# Patient Record
Sex: Male | Born: 1960 | Race: White | Hispanic: No | Marital: Married | Smoking: Never smoker
Health system: Southern US, Community
[De-identification: ages and names within clinical notes are randomized; demographics above are authoritative.]

## PROBLEM LIST (undated history)

## (undated) DIAGNOSIS — G4730 Sleep apnea, unspecified: Secondary | ICD-10-CM

## (undated) DIAGNOSIS — H269 Unspecified cataract: Secondary | ICD-10-CM

## (undated) DIAGNOSIS — I10 Essential (primary) hypertension: Secondary | ICD-10-CM

## (undated) DIAGNOSIS — E785 Hyperlipidemia, unspecified: Secondary | ICD-10-CM

## (undated) DIAGNOSIS — K219 Gastro-esophageal reflux disease without esophagitis: Secondary | ICD-10-CM

## (undated) DIAGNOSIS — T7840XA Allergy, unspecified, initial encounter: Secondary | ICD-10-CM

## (undated) DIAGNOSIS — H33312 Horseshoe tear of retina without detachment, left eye: Secondary | ICD-10-CM

## (undated) HISTORY — PX: ANAL FISSURE REPAIR: SHX2312

## (undated) HISTORY — DX: Horseshoe tear of retina without detachment, left eye: H33.312

## (undated) HISTORY — PX: EYE SURGERY: SHX253

## (undated) HISTORY — DX: Hyperlipidemia, unspecified: E78.5

## (undated) HISTORY — DX: Allergy, unspecified, initial encounter: T78.40XA

## (undated) HISTORY — DX: Gastro-esophageal reflux disease without esophagitis: K21.9

## (undated) HISTORY — DX: Essential (primary) hypertension: I10

## (undated) HISTORY — DX: Sleep apnea, unspecified: G47.30

## (undated) HISTORY — DX: Unspecified cataract: H26.9

---

## 2004-01-26 ENCOUNTER — Ambulatory Visit: Payer: Self-pay | Admitting: Internal Medicine

## 2004-02-22 ENCOUNTER — Ambulatory Visit: Payer: Self-pay | Admitting: Internal Medicine

## 2004-03-18 ENCOUNTER — Ambulatory Visit: Payer: Self-pay | Admitting: Family Medicine

## 2004-09-04 ENCOUNTER — Ambulatory Visit: Payer: Self-pay | Admitting: Internal Medicine

## 2005-04-23 ENCOUNTER — Ambulatory Visit: Payer: Self-pay | Admitting: Internal Medicine

## 2005-05-07 ENCOUNTER — Ambulatory Visit: Payer: Self-pay | Admitting: Internal Medicine

## 2006-02-28 ENCOUNTER — Ambulatory Visit: Payer: Self-pay | Admitting: Internal Medicine

## 2006-02-28 LAB — CONVERTED CEMR LAB
Alkaline Phosphatase: 66 units/L (ref 39–117)
Bilirubin, Direct: 0.3 mg/dL (ref 0.0–0.3)
CO2: 27 meq/L (ref 19–32)
Cholesterol: 250 mg/dL (ref 0–200)
Creatinine, Ser: 1 mg/dL (ref 0.4–1.5)
Glucose, Bld: 88 mg/dL (ref 70–99)
HDL: 34.1 mg/dL — ABNORMAL LOW (ref >39.0)
LDL DIRECT: 175.1 mg/dL
Potassium: 4.6 meq/L (ref 3.5–5.1)
Sodium: 138 meq/L (ref 135–145)
Total Bilirubin: 1.5 mg/dL — ABNORMAL HIGH (ref 0.3–1.2)
Total Protein: 7.4 g/dL (ref 6.0–8.3)

## 2006-03-25 ENCOUNTER — Ambulatory Visit: Payer: Self-pay | Admitting: Internal Medicine

## 2006-05-06 ENCOUNTER — Ambulatory Visit: Payer: Self-pay | Admitting: Internal Medicine

## 2006-05-06 LAB — CONVERTED CEMR LAB
AST: 32 units/L (ref 0–37)
CRP, High Sensitivity: 2 (ref 0.00–5.00)
Cholesterol: 104 mg/dL (ref 0–200)
LDL Cholesterol: 41 mg/dL (ref 0–99)

## 2006-06-04 ENCOUNTER — Ambulatory Visit: Payer: Self-pay | Admitting: Internal Medicine

## 2006-08-02 ENCOUNTER — Ambulatory Visit: Payer: Self-pay | Admitting: Internal Medicine

## 2006-12-16 ENCOUNTER — Telehealth: Payer: Self-pay | Admitting: Internal Medicine

## 2007-04-11 ENCOUNTER — Ambulatory Visit: Payer: Self-pay | Admitting: Internal Medicine

## 2007-04-11 DIAGNOSIS — E782 Mixed hyperlipidemia: Secondary | ICD-10-CM

## 2007-04-11 DIAGNOSIS — R079 Chest pain, unspecified: Secondary | ICD-10-CM

## 2007-06-03 ENCOUNTER — Ambulatory Visit: Payer: Self-pay | Admitting: Internal Medicine

## 2007-06-08 LAB — CONVERTED CEMR LAB
ALT: 27 units/L (ref 0–53)
AST: 28 units/L (ref 0–37)
HDL: 28.9 mg/dL — ABNORMAL LOW (ref >39.0)
Total Bilirubin: 1.4 mg/dL — ABNORMAL HIGH (ref 0.3–1.2)

## 2007-06-09 ENCOUNTER — Telehealth: Payer: Self-pay | Admitting: *Deleted

## 2007-06-09 ENCOUNTER — Ambulatory Visit: Payer: Self-pay | Admitting: Internal Medicine

## 2007-06-27 ENCOUNTER — Telehealth (INDEPENDENT_AMBULATORY_CARE_PROVIDER_SITE_OTHER): Payer: Self-pay | Admitting: *Deleted

## 2007-07-16 ENCOUNTER — Ambulatory Visit: Payer: Self-pay | Admitting: Cardiovascular Disease

## 2007-07-25 ENCOUNTER — Ambulatory Visit: Payer: Self-pay

## 2007-07-25 ENCOUNTER — Encounter: Payer: Self-pay | Admitting: Cardiovascular Disease

## 2010-07-18 NOTE — Assessment & Plan Note (Signed)
[facility]                            CARDIOLOGY OFFICE NOTE   Jason Chavez, Jason Chavez                  MRN:          [id number]  DATE:07/16/2007                            DOB:          08-Nov-1960    Mr. Hinderliter is a 50-year patient of Dr. Fabian Sharp.  He has been having  chest pain.  He has hypercholesterolemia.   Chest pain is atypical, is clearly related to Statin drugs.  The patient  has had hypercholesterolemia for quite some time he describes a litany  of cholesterol drug trials dating back approximately 4 years.   He initially was started on Lipitor, he was then switched to Zocor and  then switch to Vytorin and now is on Crestor almost.  All of the time  that he has been on a Statin drug for any length of time, he gets  atypical chest pain.  It is nonexertional, it is sharp, it is tender to  the touch and it is not pleuritic.  It clearly is related to be on  Statins for a while.  His last LDL cholesterol done Jul 14, 2007, showed  an HDL of 28.9, LDL of 66.   His LFTs have  been normal.  I do not have CPK results.   The patient's coronary risk factors otherwise include positive family  history, father dying at age 58 of heart disease.   He is not hypertensive, he has an excellent vegan diet.  He is not  diabetic and does not smoke.   The patient actually, from what I can tell, would not really want to be  on Statin drugs.   He is otherwise active.  He exercises and walks 30 minutes a day without  chest pain.  There is no PND, orthopnea, no palpitations or syncope.   PAST MEDICAL HISTORY:  Otherwise unremarkable.  He has not had any  previous surgery.   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  He is married.  He has a 52 year old daughter.   FAMILY HISTORY:  Remarkable for mother dying of natural causes at age  15.  Father dying of heart disease at age 67.   He is currently taking Crestor or 10 a  day, although I think he stopped  it last week.   REVIEW OF SYSTEMS:  Otherwise remarkable for allergies.   PHYSICAL EXAMINATION:  GENERAL APPEARANCE:  Remarkable for a healthy-  appearing [place] male in no distress.  VITAL SIGNS:  Blood pressure is 120/70, pulse is 72 and regular,  afebrile, respiratory rate 14.  HEENT:  Unremarkable.  NECK:  Carotids are normal without bruit, no lymphadenopathy,  thyromegaly or JVP elevation.  LUNGS:  Clear with good diaphragmatic motion.  No wheezing.  There is a  systolic ejection murmur.  PMI normal.  ABDOMEN:  Benign.  Bowel sounds positive, no AAA, no tenderness, no  hepatosplenomegaly or hepatojugular reflux.  No tenderness, no bruit.  EXTREMITIES:  Distal pulses are intact, no edema.  NEURO:  Nonfocal.  SKIN:  Warm and dry.  MUSCULOSKELETAL:  No muscular weakness.  EKG is normal.   IMPRESSION:  1. Atypical chest pain in the setting of Statin drugs.  Since the      patient has a systolic murmur and he needs an echo, we will do a      stress echo on him.  2. Systolic ejection murmur, rule out bicuspid aortic valve, sounds      benign, but needs to be interrogated.  Check echo.  3. History of hypercholesterolemia.  I think that the patient should      be not on Statin drugs.  He has tried at least five different ones      and they all seem to cause chest pain and make him feel poorly.      His low HDL can be treated with a glass of wine if appropriate and      regular exercise.  I suspect that if he tried Niaspan, which would      be a better drug for racing HDL, that he may get flushing given his      side effects from Statin drugs.  However, it is pretty clear over      the last 4 years that the patient does not tolerate Statin drugs.   So long as his stress echo is normal, he will be seen on an as-needed  basis.     Noralyn Pick. Eden Emms, MD, [facility]  Electronically Signed    PCN/MedQ  DD: 07/16/2007  DT: 07/16/2007  Job #: [id number]

## 2012-06-02 ENCOUNTER — Encounter: Payer: Self-pay | Admitting: Internal Medicine

## 2012-06-02 ENCOUNTER — Ambulatory Visit (INDEPENDENT_AMBULATORY_CARE_PROVIDER_SITE_OTHER): Payer: 59 | Admitting: Internal Medicine

## 2012-06-02 VITALS — BP 114/78 | HR 64 | Wt 206.6 lb

## 2012-06-02 DIAGNOSIS — K625 Hemorrhage of anus and rectum: Secondary | ICD-10-CM

## 2012-06-02 DIAGNOSIS — Z1211 Encounter for screening for malignant neoplasm of colon: Secondary | ICD-10-CM

## 2012-06-02 DIAGNOSIS — K219 Gastro-esophageal reflux disease without esophagitis: Secondary | ICD-10-CM

## 2012-06-02 DIAGNOSIS — K5900 Constipation, unspecified: Secondary | ICD-10-CM | POA: Insufficient documentation

## 2012-06-02 MED ORDER — POLYETHYLENE GLYCOL 3350 17 G PO PACK: 17.0000 g | PACK | Freq: Every day | ORAL | Status: DC

## 2012-06-02 MED ORDER — PEG-KCL-NACL-NASULF-NA ASC-C 100 G PO SOLR: 1.0000 | Freq: Once | ORAL | Status: DC

## 2012-06-02 MED ORDER — FAMOTIDINE 20 MG PO TABS: 20.0000 mg | ORAL_TABLET | Freq: Every evening | ORAL | Status: DC | PRN

## 2012-06-02 NOTE — Patient Instructions (Addendum)
You have been scheduled for a colonoscopy with propofol. Please follow written instructions given to you at your visit today.  Please pick up your prep kit at the pharmacy within the next 1-3 days. If you use inhalers (even only as needed), please bring them with you on the day of your procedure.  We have sent the following medications to your pharmacy for you to pick up at your convenience: pepcid, miralax                                               We are excited to introduce MyChart, a new best-in-class service that provides you online access to important information in your electronic medical record. We want to make it easier for you to view your health information - all in one secure location - when and where you need it. We expect MyChart will enhance the quality of care and service we provide.  When you register for MyChart, you can:    View your test results.    Request appointments and receive appointment reminders via email.    Request medication renewals.    View your medical history, allergies, medications and immunizations.    Communicate with your physician's office through a password-protected site.    Conveniently print information such as your medication lists.  To find out if MyChart is right for you, please talk to a member of our clinical staff today. We will gladly answer your questions about this free health and wellness tool.  If you are age 54 or older and want a member of your family to have access to your record, you must provide written consent by completing a proxy form available at our office. Please speak to our clinical staff about guidelines regarding accounts for patients younger than age 43.  As you activate your MyChart account and need any technical assistance, please call the MyChart technical support line at [phone]) or email your question to [email or web address]. If you email your question(s), please include your name, a  return phone number and the best time to reach you.  If you have non-urgent health-related questions, you can send a message to our office through MyChart at [email or web address]. If you have a medical emergency, call 911.  Thank you for using MyChart as your new health and wellness resource!   MyChart licensed from [employer],  4540-9811. Patents Pending.

## 2012-06-02 NOTE — Progress Notes (Signed)
Patient ID: Jason Chavez, male   DOB: [birth year], 52 y.o.   MRN: [id number] HPI: Mr. Quast is a 52 yo male with a past medical history of hyperlipidemia who is seen in consultation at the request of Dr. Felipa Eth for evaluation of constipation and lower abdominal pain. The patient reports last week he developed what he felt was a GI "bug" with mid and lower abdominal pain making it difficult to eat. Initially this was associated with nausea. He reported some lower abdominal cramping type pain. He did have one episode of emesis, nonbloody. Last Wednesday he reportedly had a normal bowel movement but then afterwards had no bowel movements which is unusual for him. He develop constipation over the weekend went to urgent care. CT scan of the abdomen and pelvis was done and he was told that he had a fecal impaction. He was given enemas with eventual good response. He was also having difficult time with urination and they propose that this was possibly secondary to constipation. The patient reports he has had intermittent bouts of constipation over the last 6-8 months. He has occasionally seen red blood in his stool which been attributed to hemorrhoids. Otherwise he reports good appetite, no weight loss. Intermittent heartburn occurring usually at night less than weekly. It tends to be worse when he eats spicy foods and particularly large meals late at night. He has tried Pepto-Bismol for this with good relief.  Denies family history of CRC and no previous colonoscopy  Past Medical History  Diagnosis Date  . Hyperlipemia     Past Surgical History  Procedure Laterality Date  . Anal fissure repair      Current Outpatient Prescriptions  Medication Sig Dispense Refill  . polyethylene glycol (MIRALAX / GLYCOLAX) packet Take 17 g by mouth daily.      . psyllium (METAMUCIL) 58.6 % packet Take 1 packet by mouth daily.      . simvastatin (ZOCOR) 10 MG tablet Take 10 mg by mouth at bedtime.       No  current facility-administered medications for this visit.    No Known Allergies  Family History  Problem Relation Age of Onset  . Heart disease Father     History   Social History  . Marital Status: Married    Spouse Name: N/A    Number of Children: 1  . Years of Education: N/A   Occupational History  . System analyst    Social History Main Topics  . Smoking status: Never Smoker   . Smokeless tobacco: Never Used  . Alcohol Use: Yes     Comment: Wine and beer  . Drug Use: No  . Sexually Active: None   Other Topics Concern  . None   Social History Narrative  . None    ROS: As per history of present illness, otherwise negative  BP 114/78  Pulse 64  Wt 206 lb 9.6 oz (93.713 kg) Constitutional: Well-developed and well-nourished. No distress. HEENT: Normocephalic and atraumatic. Oropharynx is clear and moist. No oropharyngeal exudate. Conjunctivae are normal.  No scleral icterus. Neck: Neck supple. Trachea midline. Cardiovascular: Normal rate, regular rhythm and intact distal pulses. No M/R/G Pulmonary/chest: Effort normal and breath sounds normal. No wheezing, rales or rhonchi. Abdominal: Soft, nontender, nondistended. Bowel sounds active throughout. There are no masses palpable. No hepatosplenomegaly. Extremities: no clubbing, cyanosis, or edema Lymphadenopathy: No cervical adenopathy noted. Neurological: Alert and oriented to person place and time. Skin: Skin is warm and dry. No rashes noted. Psychiatric:  Normal mood and affect. Behavior is normal.  RELEVANT LABS AND IMAGING: Labs dated 05/31/2012 Sodium 137 potassium 4.2 chloride 100 CO2 27 BUN 13 glucose 107 craning 0.76 calcium 10.0 alkaline phosphatase 60 9T bili 0.8 total protein 8.5 albumin 4.4 AST 30 ALT 38 WBC 10.8, hemoglobin 13.8, hematocrit 40.5, platelet count 341  CT abdomen pelvis with IV contrast and depression 1. Rectum distended with stool. 2. Mild fullness of the right renal collecting system.  This may be caused by distended bladder. ("Moderate stool in a distended rectum. No diverticulosis or diverticulitis. Normal appendix. Spleen, bilateral adrenal glands, and pancreas are unremarkable. Gallbladder present. No biliary ductal palpation. No focal liver lesions. Small hiatal hernia. No adenopathy".)  ASSESSMENT/PLAN: 52 yo male with a past medical history of hyperlipidemia who is seen in consultation at the request of Dr. Felipa Eth for evaluation of constipation and lower abdominal pain.  1.  Constipation/rectal bleeding/CRC screening -- it sounds like the patient may have had a GI illness made week with resultant slower bowel transit/constipation. This all became acutely worse over the weekend necessitating ER visit where CT scan and labs were reassuring. He responded to enemas. The pain resolved with resolution of constipation. I recommended he continue with MiraLAX 17 g daily. See #2, but he will avoid Pepto-Bismol for future GERD symptoms as this can be more constipating. I have recommended colonoscopy for colorectal cancer screening and given his history of rectal bleeding. We discussed the test today including the risks and benefits and he is agreeable to proceed. If his pain returns he is asked to notify us.  2.  intermittent GERD -- I recommended Pepcid 20 mg every 12 hours when necessary for breakthrough heartburn. He is without other alarm symptom and this is rare so I feel like as needed H2 blocker is reasonable for now. This becomes more frequent, he may need PPI .

## 2012-06-03 ENCOUNTER — Encounter: Payer: Self-pay | Admitting: Internal Medicine

## 2012-06-17 ENCOUNTER — Encounter: Payer: Self-pay | Admitting: Internal Medicine

## 2012-06-24 ENCOUNTER — Telehealth: Payer: Self-pay | Admitting: Internal Medicine

## 2012-06-24 NOTE — Telephone Encounter (Signed)
Dr. Vicente Males office requested Consult records from 06/02/2012 on 06/16/2012  Faxed 5 pages of Office Note from 06/02/2012   asw

## 2012-07-14 ENCOUNTER — Encounter: Payer: Self-pay | Admitting: Internal Medicine

## 2012-07-14 ENCOUNTER — Ambulatory Visit (AMBULATORY_SURGERY_CENTER): Payer: 59 | Admitting: Internal Medicine

## 2012-07-14 VITALS — BP 115/77 | HR 57 | Temp 98.1°F | Resp 26 | Ht 73.0 in | Wt 206.0 lb

## 2012-07-14 DIAGNOSIS — D126 Benign neoplasm of colon, unspecified: Secondary | ICD-10-CM

## 2012-07-14 DIAGNOSIS — Z1211 Encounter for screening for malignant neoplasm of colon: Secondary | ICD-10-CM

## 2012-07-14 DIAGNOSIS — K625 Hemorrhage of anus and rectum: Secondary | ICD-10-CM

## 2012-07-14 MED ORDER — SODIUM CHLORIDE 0.9 % IV SOLN: 500.0000 mL | INTRAVENOUS | Status: DC

## 2012-07-14 NOTE — Progress Notes (Signed)
Patient did not experience any of the following events: a burn prior to discharge; a fall within the facility; wrong site/side/patient/procedure/implant event; or a hospital transfer or hospital admission upon discharge from the facility. (G8907) Patient did not have preoperative order for IV antibiotic SSI prophylaxis. (G8918)  

## 2012-07-14 NOTE — Patient Instructions (Addendum)
Handouts were given to your care partner on polyps, diverticulosis, high fiber diet and hemorrhoids.  You may resume your current medications today.  Please call if any questions or concerns.     YOU HAD AN ENDOSCOPIC PROCEDURE TODAY AT THE [facility]: Refer to the procedure report that was given to you for any specific questions about what was found during the examination.  If the procedure report does not answer your questions, please call your gastroenterologist to clarify.  If you requested that your care partner not be given the details of your procedure findings, then the procedure report has been included in a sealed envelope for you to review at your convenience later.  YOU SHOULD EXPECT: Some feelings of bloating in the abdomen. Passage of more gas than usual.  Walking can help get rid of the air that was put into your GI tract during the procedure and reduce the bloating. If you had a lower endoscopy (such as a colonoscopy or flexible sigmoidoscopy) you may notice spotting of blood in your stool or on the toilet paper. If you underwent a bowel prep for your procedure, then you may not have a normal bowel movement for a few days.  DIET: Your first meal following the procedure should be a light meal and then it is ok to progress to your normal diet.  A half-sandwich or bowl of soup is an example of a good first meal.  Heavy or fried foods are harder to digest and may make you feel nauseous or bloated.  Likewise meals heavy in dairy and vegetables can cause extra gas to form and this can also increase the bloating.  Drink plenty of fluids but you should avoid alcoholic beverages for 24 hours.  ACTIVITY: Your care partner should take you home directly after the procedure.  You should plan to take it easy, moving slowly for the rest of the day.  You can resume normal activity the day after the procedure however you should NOT DRIVE or use heavy machinery for 24 hours (because of the  sedation medicines used during the test).    SYMPTOMS TO REPORT IMMEDIATELY: A gastroenterologist can be reached at any hour.  During normal business hours, 8:30 AM to 5:00 PM Monday through Friday, call [phone].  After hours and on weekends, please call the GI answering service at [phone] who will take a message and have the physician on call contact you.   Following lower endoscopy (colonoscopy or flexible sigmoidoscopy):  Excessive amounts of blood in the stool  Significant tenderness or worsening of abdominal pains  Swelling of the abdomen that is new, acute  Fever of 100F or higher    FOLLOW UP: If any biopsies were taken you will be contacted by phone or by letter within the next 1-3 weeks.  Call your gastroenterologist if you have not heard about the biopsies in 3 weeks.  Our staff will call the home number listed on your records the next business day following your procedure to check on you and address any questions or concerns that you may have at that time regarding the information given to you following your procedure. This is a courtesy call and so if there is no answer at the home number and we have not heard from you through the emergency physician on call, we will assume that you have returned to your regular daily activities without incident.  SIGNATURES/CONFIDENTIALITY: You and/or your care partner have signed paperwork which will be   entered into your electronic medical record.  These signatures attest to the fact that that the information above on your After Visit Summary has been reviewed and is understood.  Full responsibility of the confidentiality of this discharge information lies with you and/or your care-partner.  

## 2012-07-14 NOTE — Progress Notes (Signed)
No complaints noted in the recovery room. Maw   

## 2012-07-14 NOTE — Progress Notes (Signed)
Called to room to assist during endoscopic procedure.  Patient ID and intended procedure confirmed with present staff. Received instructions for my participation in the procedure from the performing physician.  

## 2012-07-14 NOTE — Op Note (Signed)
[facility] [address] [place], [address]   COLONOSCOPY PROCEDURE REPORT  PATIENT: Jason Chavez, Jason Chavez  MR#: [id number] BIRTHDATE: 1960/07/07 , 51  yrs. old GENDER: Male ENDOSCOPIST: Beverley Fiedler, MD REFERRED XB:JYNW, Ravisankar PROCEDURE DATE:  07/14/2012 PROCEDURE:   Colonoscopy with cold biopsy polypectomy ASA CLASS:   Class II INDICATIONS:average risk screening and Rectal Bleeding. MEDICATIONS: MAC sedation, administered by CRNA and propofol (Diprivan) 300mg  IV  DESCRIPTION OF PROCEDURE:   After the risks benefits and alternatives of the procedure were thoroughly explained, informed consent was obtained.  A digital rectal exam revealed no rectal mass.   The LB CF-[id number]  endoscope was introduced through the anus and advanced to the cecum, which was identified by both the appendix and ileocecal valve. No adverse events experienced. The quality of the prep was good, using MoviPrep  The instrument was then slowly withdrawn as the colon was fully examined.   COLON FINDINGS: Two sessile polyps ranging between 3-69mm in size were found in the transverse colon and sigmoid colon.  Polypectomy was performed with cold forceps.  All resections were complete and all polyp tissue was completely retrieved.   Moderate diverticulosis was noted in the ascending colon and at the hepatic flexure.  Retroflexed views revealed small external hemorrhoids and was otherwise normal. The time to cecum=4 minutes 37 seconds. Withdrawal time=13 minutes 20 seconds.  The scope was withdrawn and the procedure completed. COMPLICATIONS: There were no complications.  ENDOSCOPIC IMPRESSION: 1.   Two sessile polyps ranging between 3-24mm in size were found in the transverse colon and sigmoid colon; Polypectomy was performed with cold forceps 2.   Moderate diverticulosis was noted in the ascending colon and at the hepatic flexure 3.   Small external  hemorrhoids  RECOMMENDATIONS: 1.  Await pathology results 2.  High fiber diet 3.  If the polyps removed today are proven to be adenomatous (pre-cancerous) polyps, you will need a repeat colonoscopy in 5 years.  Otherwise you should continue to follow colorectal cancer screening guidelines for "routine risk" patients with colonoscopy in 10 years.  You will receive a letter within 1-2 weeks with the results of your biopsy as well as final recommendations.  Please call my office if you have not received a letter after 3 weeks.   eSigned:  Beverley Fiedler, MD 07/14/2012 11:26 AMn       cc:The Patient

## 2012-07-15 ENCOUNTER — Telehealth: Payer: Self-pay

## 2012-07-15 NOTE — Telephone Encounter (Signed)
  Follow up Call-  Call back number 07/14/2012  Post procedure Call Back phone  # [phone]  Permission to leave phone message Yes     Patient questions:  Do you have a fever, pain , or abdominal swelling? no Pain Score  0 *  Have you tolerated food without any problems? yes  Have you been able to return to your normal activities? yes  Do you have any questions about your discharge instructions: Diet   no Medications  no Follow up visit  no  Do you have questions or concerns about your Care? no  Actions: * If pain score is 4 or above: No action needed, pain <4.

## 2012-07-17 ENCOUNTER — Encounter: Payer: Self-pay | Admitting: Internal Medicine

## 2012-07-29 ENCOUNTER — Telehealth: Payer: Self-pay | Admitting: Internal Medicine

## 2012-07-29 MED ORDER — HYDROCORTISONE ACETATE 25 MG RE SUPP: RECTAL | Status: DC

## 2012-07-29 NOTE — Telephone Encounter (Signed)
Informed pt of Dr Lauro Franklin remarks and he stated understanding; ordered suppositories.

## 2012-07-29 NOTE — Telephone Encounter (Signed)
His 2 polyps were adenomatous and small. If he would like to discuss this in person, I'm happy to see him in the office. For family members I recommend colonoscopy for colorectal cancer screening beginning at age 54 (in the absence of other symptoms or other family history which may indicate earlier exam) He did have hemorrhoids and I feel that MiraLax 17 g daily will help avoid constipation and further exacerbation of hemorrhoids We can try hydrocortisone 25 mg suppositories twice daily for 5-7 days If symptoms fail to improve he should notify us

## 2012-07-29 NOTE — Telephone Encounter (Signed)
Pt seen 06/02/12 for rectal bleeding and constipation and had COLON on 07/14/12 showing external hemorrhoids and 2 adenomatous polyps. He was to take Miralax daily for his constipation and pepcid daily if GERD did not worsen. Today, we discussed for a long time adenomatous polyps, recalls and whether his family in [place] should be concerned with CRC screening. Pt then reports he did not take Miralax Saturday and Sunday and developed pain and bleeding with his BM's. He started the Miralax again and the blood in his stools and on toilet tissue have lessened, still having a little rectal discomfort.  Can we order something for Hemorrhoids? Thanks,

## 2012-08-04 ENCOUNTER — Telehealth: Payer: Self-pay | Admitting: Internal Medicine

## 2012-08-04 NOTE — Telephone Encounter (Signed)
Given previous symptoms, trial of pantoprazole 40 mg daily He should take this 30 minutes to one hour before his first meal of the day Call back if no improvement

## 2012-08-04 NOTE — Telephone Encounter (Signed)
Pt reports he vomited Saturday and Sunday. He also c/o upper abdominal on both sides Saturday and Sunday, but today states the pain is in the middle. He denies eating spicy foods and takes Pepcid QHS PRN. Please advise. Thanks.

## 2012-08-05 NOTE — Telephone Encounter (Signed)
Spoke with pt to inform him we will order pantoprazole to see if that helps. Pt stated he figured out his problem; he took Metamucil last week and that's when his problem started. He has stopped it and now he feels better. Pt states the rectal bleeding has greatly cleared. He will call for problems and thanked [place].

## 2012-08-08 ENCOUNTER — Other Ambulatory Visit: Payer: Self-pay | Admitting: *Deleted

## 2012-08-08 MED ORDER — POLYETHYLENE GLYCOL 3350 17 GM/SCOOP PO POWD: 17.0000 g | Freq: Every day | ORAL | Status: DC

## 2012-12-10 ENCOUNTER — Other Ambulatory Visit: Payer: Self-pay | Admitting: Internal Medicine

## 2012-12-25 ENCOUNTER — Telehealth: Payer: Self-pay | Admitting: Internal Medicine

## 2012-12-25 MED ORDER — HYDROCORTISONE ACETATE 25 MG RE SUPP: 25.0000 mg | Freq: Two times a day (BID) | RECTAL | Status: DC | PRN

## 2012-12-25 NOTE — Telephone Encounter (Signed)
Pt reports his hemorrhoids are still painful and bleed at times . He has only 1 suppository left. Hemorrhoids, external listed on COLON done 07/2012. He reports he is using stool softeners and has a BM, it's just very painful and the pain lasts all day after the BM; the bleeding disturbs him. Instructed pt to take sitz baths or soaks with warn water at least BID; use baby wipes or Tucks to wipe with while irritated instead of toilet paper. Left message on pt's VM that we ordered more suppositories and to call back if this is not helping.

## 2013-01-06 ENCOUNTER — Telehealth: Payer: Self-pay | Admitting: *Deleted

## 2013-01-06 NOTE — Telephone Encounter (Signed)
Message copied by Florene Glen on Tue Jan 06, 2013  8:01 AM ------      Message from: Roda Shutters      Created: Mon Dec 29, 2012 12:21 PM       Per pt he is returning your call about last weeks conversation.  ------

## 2013-01-07 NOTE — Telephone Encounter (Signed)
lmom for pt to call back if still having problems.

## 2013-01-19 ENCOUNTER — Encounter (INDEPENDENT_AMBULATORY_CARE_PROVIDER_SITE_OTHER): Payer: Self-pay | Admitting: Surgery

## 2013-01-19 ENCOUNTER — Ambulatory Visit (INDEPENDENT_AMBULATORY_CARE_PROVIDER_SITE_OTHER): Payer: 59 | Admitting: Surgery

## 2013-01-19 ENCOUNTER — Encounter (INDEPENDENT_AMBULATORY_CARE_PROVIDER_SITE_OTHER): Payer: Self-pay

## 2013-01-19 VITALS — BP 152/90 | HR 68 | Resp 20 | Ht 72.0 in | Wt 208.8 lb

## 2013-01-19 DIAGNOSIS — K649 Unspecified hemorrhoids: Secondary | ICD-10-CM | POA: Insufficient documentation

## 2013-01-19 MED ORDER — PRAMOXINE HCL 1 % RE FOAM: 1.0000 "application " | Freq: Three times a day (TID) | RECTAL | Status: DC | PRN

## 2013-01-19 NOTE — Patient Instructions (Signed)
High-Fiber Diet Fiber is found in fruits, vegetables, and grains. A high-fiber diet encourages the addition of more whole grains, legumes, fruits, and vegetables in your diet. The recommended amount of fiber for adult males is 38 g per day. For adult females, it is 25 g per day. Pregnant and lactating women should get 28 g of fiber per day. If you have a digestive or bowel problem, ask your caregiver for advice before adding high-fiber foods to your diet. Eat a variety of high-fiber foods instead of only a select few type of foods.  PURPOSE  To increase stool bulk.  To make bowel movements more regular to prevent constipation.  To lower cholesterol.  To prevent overeating. WHEN IS THIS DIET USED?  It may be used if you have constipation and hemorrhoids.  It may be used if you have uncomplicated diverticulosis (intestine condition) and irritable bowel syndrome.  It may be used if you need help with weight management.  It may be used if you want to add it to your diet as a protective measure against atherosclerosis, diabetes, and cancer. SOURCES OF FIBER  Whole-grain breads and cereals.  Fruits, such as apples, oranges, bananas, berries, prunes, and pears.  Vegetables, such as green peas, carrots, sweet potatoes, beets, broccoli, cabbage, spinach, and artichokes.  Legumes, such split peas, soy, lentils.  Almonds. FIBER CONTENT IN FOODS Starches and Grains / Dietary Fiber (g)  Cheerios, 1 cup / 3 g  Corn Flakes cereal, 1 cup / 0.7 g  Rice crispy treat cereal, 1 cup / 0.3 g  Instant oatmeal (cooked),  cup / 2 g  Frosted wheat cereal, 1 cup / 5.1 g  Brown, long-grain rice (cooked), 1 cup / 3.5 g  White, long-grain rice (cooked), 1 cup / 0.6 g  Enriched macaroni (cooked), 1 cup / 2.5 g Legumes / Dietary Fiber (g)  Baked beans (canned, plain, or vegetarian),  cup / 5.2 g  Kidney beans (canned),  cup / 6.8 g  Pinto beans (cooked),  cup / 5.5 g Breads and Crackers  / Dietary Fiber (g)  Plain or honey graham crackers, 2 squares / 0.7 g  Saltine crackers, 3 squares / 0.3 g  Plain, salted pretzels, 10 pieces / 1.8 g  Whole-wheat bread, 1 slice / 1.9 g  White bread, 1 slice / 0.7 g  Raisin bread, 1 slice / 1.2 g  Plain bagel, 3 oz / 2 g  Flour tortilla, 1 oz / 0.9 g  Corn tortilla, 1 small / 1.5 g  Hamburger or hotdog bun, 1 small / 0.9 g Fruits / Dietary Fiber (g)  Apple with skin, 1 medium / 4.4 g  Sweetened applesauce,  cup / 1.5 g  Banana,  medium / 1.5 g  Grapes, 10 grapes / 0.4 g  Orange, 1 small / 2.3 g  Raisin, 1.5 oz / 1.6 g  Melon, 1 cup / 1.4 g Vegetables / Dietary Fiber (g)  Green beans (canned),  cup / 1.3 g  Carrots (cooked),  cup / 2.3 g  Broccoli (cooked),  cup / 2.8 g  Peas (cooked),  cup / 4.4 g  Mashed potatoes,  cup / 1.6 g  Lettuce, 1 cup / 0.5 g  Corn (canned),  cup / 1.6 g  Tomato,  cup / 1.1 g Document Released: 02/19/2005 Document Revised: 08/21/2011 Document Reviewed: 05/24/2011 [employer] Patient Information 2014 [employer]. Hemorrhoids Hemorrhoids are swollen veins around the rectum or anus. There are two types of hemorrhoids:     Internal hemorrhoids. These occur in the veins just inside the rectum. They may poke through to the outside and become irritated and painful.  External hemorrhoids. These occur in the veins outside the anus and can be felt as a painful swelling or hard lump near the anus. CAUSES  Pregnancy.   Obesity.   Constipation or diarrhea.   Straining to have a bowel movement.   Sitting for long periods on the toilet.  Heavy lifting or other activity that caused you to strain.  Anal intercourse. SYMPTOMS   Pain.   Anal itching or irritation.   Rectal bleeding.   Fecal leakage.   Anal swelling.   One or more lumps around the anus.  DIAGNOSIS  Your caregiver may be able to diagnose hemorrhoids by visual examination. Other  examinations or tests that may be performed include:   Examination of the rectal area with a gloved hand (digital rectal exam).   Examination of anal canal using a small tube (scope).   A blood test if you have lost a significant amount of blood.  A test to look inside the colon (sigmoidoscopy or colonoscopy). TREATMENT Most hemorrhoids can be treated at home. However, if symptoms do not seem to be getting better or if you have a lot of rectal bleeding, your caregiver may perform a procedure to help make the hemorrhoids get smaller or remove them completely. Possible treatments include:   Placing a rubber band at the base of the hemorrhoid to cut off the circulation (rubber band ligation).   Injecting a chemical to shrink the hemorrhoid (sclerotherapy).   Using a tool to burn the hemorrhoid (infrared light therapy).   Surgically removing the hemorrhoid (hemorrhoidectomy).   Stapling the hemorrhoid to block blood flow to the tissue (hemorrhoid stapling).  HOME CARE INSTRUCTIONS   Eat foods with fiber, such as whole grains, beans, nuts, fruits, and vegetables. Ask your doctor about taking products with added fiber in them (fibersupplements).  Increase fluid intake. Drink enough water and fluids to keep your urine clear or pale yellow.   Exercise regularly.   Go to the bathroom when you have the urge to have a bowel movement. Do not wait.   Avoid straining to have bowel movements.   Keep the anal area dry and clean. Use wet toilet paper or moist towelettes after a bowel movement.   Medicated creams and suppositories may be used or applied as directed.   Only take over-the-counter or prescription medicines as directed by your caregiver.   Take warm sitz baths for 15 20 minutes, 3 4 times a day to ease pain and discomfort.   Place ice packs on the hemorrhoids if they are tender and swollen. Using ice packs between sitz baths may be helpful.   Put ice in a  plastic bag.   Place a towel between your skin and the bag.   Leave the ice on for 15 20 minutes, 3 4 times a day.   Do not use a donut-shaped pillow or sit on the toilet for long periods. This increases blood pooling and pain.  SEEK MEDICAL CARE IF:  You have increasing pain and swelling that is not controlled by treatment or medicine.  You have uncontrolled bleeding.  You have difficulty or you are unable to have a bowel movement.  You have pain or inflammation outside the area of the hemorrhoids. MAKE SURE YOU:  Understand these instructions.  Will watch your condition.  Will get help right away if you   are not doing well or get worse. Document Released: 02/17/2000 Document Revised: 02/06/2012 Document Reviewed: 12/25/2011 [facility] Patient Information 2014 [place], [place].          Miralax  OTC 1 cap per day Metamucil or citrucel as directed on label 64 oz water daily Limit caffeine  Limit alcohol

## 2013-01-19 NOTE — Progress Notes (Signed)
Subjective:     Patient ID: Jason Chavez, male   DOB: [birth year], 52 y.o.   MRN: [id number]  HPI  Patient sent at request of Dr.Avva for hemorrhoid disease. Patient has history of procedure for hemorrhoids and indeed in the 1990s. He cannot recall exactly what was done but denies any significant pain afterwards. He is having burning, bleeding and itching. The symptoms started within the last one to 2 months. History of fecal impaction constipation in March of this year. This required trip to emergency room. Denies any recent episodes of constipation, excessive straining or difficulty having BM Review of Systems  Constitutional: Negative.   HENT: Negative.   Gastrointestinal: Positive for constipation and anal bleeding.  Genitourinary: Negative.   Skin: Negative.   Hematological: Negative.   Psychiatric/Behavioral: Negative.        Objective:   Physical Exam  Constitutional: He appears well-developed and well-nourished.  HENT:  Head: Normocephalic and atraumatic.  Eyes: Pupils are equal, round, and reactive to light. No scleral icterus.  Genitourinary:     Neurological: He is alert.  Skin: Skin is warm and dry.  Psychiatric: He has a normal mood and affect. His behavior is normal. Judgment normal.       Assessment:     Grade 3 hemorrhoid disease involving 3 columns with external component    Plan:     Discussed surgical and medical options. These are too large for banding or sclerotherapy. Discussed surgical stapling versus formal hemorrhoidectomy. Discussed medical management as well. He wishes to proceed with medical management. Return to clinic if the symptoms do not improve on medical management to discuss surgery.

## 2013-07-15 ENCOUNTER — Other Ambulatory Visit: Payer: Self-pay | Admitting: Internal Medicine

## 2014-03-17 ENCOUNTER — Other Ambulatory Visit: Payer: Self-pay | Admitting: *Deleted

## 2014-03-17 MED ORDER — FAMOTIDINE 20 MG PO TABS: ORAL_TABLET | ORAL | Status: DC

## 2016-08-17 DIAGNOSIS — Z0000 Encounter for general adult medical examination without abnormal findings: Secondary | ICD-10-CM | POA: Diagnosis not present

## 2016-08-17 DIAGNOSIS — I10 Essential (primary) hypertension: Secondary | ICD-10-CM | POA: Diagnosis not present

## 2016-08-24 DIAGNOSIS — H6123 Impacted cerumen, bilateral: Secondary | ICD-10-CM | POA: Diagnosis not present

## 2016-08-24 DIAGNOSIS — E784 Other hyperlipidemia: Secondary | ICD-10-CM | POA: Diagnosis not present

## 2016-08-24 DIAGNOSIS — I10 Essential (primary) hypertension: Secondary | ICD-10-CM | POA: Diagnosis not present

## 2016-08-24 DIAGNOSIS — Z0000 Encounter for general adult medical examination without abnormal findings: Secondary | ICD-10-CM | POA: Diagnosis not present

## 2016-08-24 DIAGNOSIS — Z1389 Encounter for screening for other disorder: Secondary | ICD-10-CM | POA: Diagnosis not present

## 2016-08-24 DIAGNOSIS — K5909 Other constipation: Secondary | ICD-10-CM | POA: Diagnosis not present

## 2016-08-28 DIAGNOSIS — H9191 Unspecified hearing loss, right ear: Secondary | ICD-10-CM | POA: Diagnosis not present

## 2016-08-28 DIAGNOSIS — H6123 Impacted cerumen, bilateral: Secondary | ICD-10-CM | POA: Diagnosis not present

## 2017-02-22 DIAGNOSIS — D1801 Hemangioma of skin and subcutaneous tissue: Secondary | ICD-10-CM | POA: Diagnosis not present

## 2017-02-22 DIAGNOSIS — L820 Inflamed seborrheic keratosis: Secondary | ICD-10-CM | POA: Diagnosis not present

## 2017-02-22 DIAGNOSIS — L918 Other hypertrophic disorders of the skin: Secondary | ICD-10-CM | POA: Diagnosis not present

## 2017-04-22 DIAGNOSIS — R05 Cough: Secondary | ICD-10-CM | POA: Diagnosis not present

## 2017-04-22 DIAGNOSIS — R509 Fever, unspecified: Secondary | ICD-10-CM | POA: Diagnosis not present

## 2017-06-07 ENCOUNTER — Encounter: Payer: Self-pay | Admitting: *Deleted

## 2017-06-17 ENCOUNTER — Encounter: Payer: Self-pay | Admitting: Internal Medicine

## 2017-10-02 DIAGNOSIS — Z86010 Personal history of colonic polyps: Secondary | ICD-10-CM | POA: Diagnosis not present

## 2017-10-02 DIAGNOSIS — K210 Gastro-esophageal reflux disease with esophagitis: Secondary | ICD-10-CM | POA: Diagnosis not present

## 2017-10-04 DIAGNOSIS — Z0000 Encounter for general adult medical examination without abnormal findings: Secondary | ICD-10-CM | POA: Diagnosis not present

## 2017-10-04 DIAGNOSIS — R82998 Other abnormal findings in urine: Secondary | ICD-10-CM | POA: Diagnosis not present

## 2017-10-04 DIAGNOSIS — I10 Essential (primary) hypertension: Secondary | ICD-10-CM | POA: Diagnosis not present

## 2017-10-10 DIAGNOSIS — Z86010 Personal history of colonic polyps: Secondary | ICD-10-CM | POA: Diagnosis not present

## 2017-10-10 DIAGNOSIS — K5730 Diverticulosis of large intestine without perforation or abscess without bleeding: Secondary | ICD-10-CM | POA: Diagnosis not present

## 2017-10-10 DIAGNOSIS — D126 Benign neoplasm of colon, unspecified: Secondary | ICD-10-CM | POA: Diagnosis not present

## 2017-10-11 DIAGNOSIS — I10 Essential (primary) hypertension: Secondary | ICD-10-CM | POA: Diagnosis not present

## 2017-10-11 DIAGNOSIS — Z0000 Encounter for general adult medical examination without abnormal findings: Secondary | ICD-10-CM | POA: Diagnosis not present

## 2017-10-11 DIAGNOSIS — Z1389 Encounter for screening for other disorder: Secondary | ICD-10-CM | POA: Diagnosis not present

## 2017-10-11 DIAGNOSIS — R945 Abnormal results of liver function studies: Secondary | ICD-10-CM | POA: Diagnosis not present

## 2018-01-08 DIAGNOSIS — E7849 Other hyperlipidemia: Secondary | ICD-10-CM | POA: Diagnosis not present

## 2018-04-14 DIAGNOSIS — I10 Essential (primary) hypertension: Secondary | ICD-10-CM | POA: Diagnosis not present

## 2018-04-14 DIAGNOSIS — Z6827 Body mass index (BMI) 27.0-27.9, adult: Secondary | ICD-10-CM | POA: Diagnosis not present

## 2019-10-28 ENCOUNTER — Other Ambulatory Visit: Payer: Self-pay | Admitting: Podiatry

## 2019-10-28 ENCOUNTER — Ambulatory Visit (INDEPENDENT_AMBULATORY_CARE_PROVIDER_SITE_OTHER): Payer: 59

## 2019-10-28 ENCOUNTER — Other Ambulatory Visit: Payer: Self-pay

## 2019-10-28 ENCOUNTER — Encounter: Payer: Self-pay | Admitting: Podiatry

## 2019-10-28 ENCOUNTER — Ambulatory Visit (INDEPENDENT_AMBULATORY_CARE_PROVIDER_SITE_OTHER): Payer: 59 | Admitting: Podiatry

## 2019-10-28 VITALS — BP 127/82 | HR 64 | Temp 96.5°F | Resp 16

## 2019-10-28 DIAGNOSIS — M722 Plantar fascial fibromatosis: Secondary | ICD-10-CM

## 2019-10-28 DIAGNOSIS — M79672 Pain in left foot: Secondary | ICD-10-CM | POA: Diagnosis not present

## 2019-10-28 NOTE — Patient Instructions (Signed)

## 2019-10-28 NOTE — Progress Notes (Signed)
Subjective:   Patient ID: Jason Chavez, male   DOB: 59 y.o.   MRN: [id number]   HPI Patient presents stating he had treatment for his left heel which gave him temporary relief but he is concerned about how the treatment occurred and he does get discomfort if he tries to be active.  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       Objective:  Physical Exam Vitals and nursing note reviewed.  Constitutional:      Appearance: He is well-developed.  Pulmonary:     Effort: Pulmonary effort is normal.  Musculoskeletal:        General: Normal range of motion.  Skin:    General: Skin is warm.  Neurological:     Mental Status: He is alert.     Neurovascular status intact muscle strength found to be adequate range of motion within normal limits.  Patient is noted to have moderate discomfort plantar aspect left heel high arch foot structure and chronic pain associated with this when on foot for too long periods of time     Assessment:  Fascial like inflammation secondary to pressure and foot structure along with weightbearing forces and work     Plan:  H&P x-ray reviewed and I recommended long-term [place] type orthotics with deep heel seat to reduce all stress on the foot and arch.  Patient at this time is casted for functional orthotics by ped orthotist and we will get these to him I recommend he continues physical therapy discussed possibility for future injection or other treatment and boot usage on an as-needed basis  X-rays indicate that there is Achilles spurring no signs of plantar spur formation currently

## 2019-11-17 ENCOUNTER — Other Ambulatory Visit: Payer: Self-pay

## 2019-11-17 ENCOUNTER — Ambulatory Visit: Payer: 59 | Admitting: Orthotics

## 2019-11-17 DIAGNOSIS — M79672 Pain in left foot: Secondary | ICD-10-CM

## 2019-11-17 NOTE — Progress Notes (Signed)
Patient came in today to pick up custom made foot orthotics.  The goals were accomplished and the patient reported no dissatisfaction with said orthotics.  Patient was advised of breakin period and how to report any issues. 

## 2019-12-03 ENCOUNTER — Telehealth: Payer: Self-pay | Admitting: Podiatry

## 2019-12-03 NOTE — Telephone Encounter (Signed)
Pt left message stating since he got the orthotics he is having pain on the top of his foot.   I returned call and spoke to pt he states he was not sure of the instructions for wearing them but has done the treadmill a few times and walked his neighborhood and the top of his foot hurts. He is scheduled to see Rick 10.4 and Dr Paulla Dolly 10.7 if needed.

## 2019-12-04 ENCOUNTER — Ambulatory Visit (INDEPENDENT_AMBULATORY_CARE_PROVIDER_SITE_OTHER): Payer: 59

## 2019-12-04 ENCOUNTER — Encounter: Payer: Self-pay | Admitting: Podiatry

## 2019-12-04 ENCOUNTER — Ambulatory Visit (INDEPENDENT_AMBULATORY_CARE_PROVIDER_SITE_OTHER): Payer: 59 | Admitting: Podiatry

## 2019-12-04 ENCOUNTER — Other Ambulatory Visit: Payer: Self-pay

## 2019-12-04 DIAGNOSIS — M79672 Pain in left foot: Secondary | ICD-10-CM | POA: Diagnosis not present

## 2019-12-04 DIAGNOSIS — M84375A Stress fracture, left foot, initial encounter for fracture: Secondary | ICD-10-CM | POA: Diagnosis not present

## 2019-12-04 DIAGNOSIS — R600 Localized edema: Secondary | ICD-10-CM | POA: Diagnosis not present

## 2019-12-07 ENCOUNTER — Ambulatory Visit: Payer: 59 | Admitting: Orthotics

## 2019-12-07 ENCOUNTER — Other Ambulatory Visit: Payer: Self-pay

## 2019-12-07 DIAGNOSIS — M84375A Stress fracture, left foot, initial encounter for fracture: Secondary | ICD-10-CM

## 2019-12-07 DIAGNOSIS — M722 Plantar fascial fibromatosis: Secondary | ICD-10-CM

## 2019-12-07 DIAGNOSIS — M79672 Pain in left foot: Secondary | ICD-10-CM

## 2019-12-07 NOTE — Progress Notes (Signed)
Added offload 3rd met for left foot; this to address possible stress fx 3rd met.  Adv isaed not to walk w/ f/o until released from boot by dr. [name].

## 2019-12-07 NOTE — Progress Notes (Signed)
Subjective:   Patient ID: Jason Chavez, male   DOB: 59 y.o.   MRN: [id number]   HPI Patient states he is doing okay with the bottom of his foot but all of a sudden the top of his foot started to get swollen and its been very tender and hard to walk on and almost feels like he is walking on a broken bone.   ROS      Objective:  Physical Exam  Neurovascular status intact negative Bevelyn Buckles' sign was noted with quite a bit of discomfort in the forefoot left mostly around the third metatarsal with patient having had history of breaking metatarsal.  It is very tender in the shaft of the third metatarsal with swelling around the area that is localized to the area patient is found to have good digital perfusion well oriented     Assessment:  Probability that there is a stress fracture left secondary to gait change and breaking metatarsal deformity     Plan:  H&P x-ray reviewed and today I applied Unna boot Ace wrap and he will wear a boot that he has at home to try to support the foot and I did explain stress fracture to him.  Patient had surgical shoe dispensed with instructions on usage and will be seen back to recheck  X-rays indicate that there is possible suspicious sign around the third metatarsal shaft left and also patient appears to have a very short fourth metatarsal which would create increased pressure against this metatarsal

## 2019-12-10 ENCOUNTER — Ambulatory Visit: Payer: 59 | Admitting: Podiatry

## 2019-12-23 ENCOUNTER — Encounter: Payer: Self-pay | Admitting: Podiatry

## 2019-12-23 ENCOUNTER — Ambulatory Visit (INDEPENDENT_AMBULATORY_CARE_PROVIDER_SITE_OTHER): Payer: 59 | Admitting: Podiatry

## 2019-12-23 ENCOUNTER — Ambulatory Visit (INDEPENDENT_AMBULATORY_CARE_PROVIDER_SITE_OTHER): Payer: 59

## 2019-12-23 ENCOUNTER — Other Ambulatory Visit: Payer: Self-pay

## 2019-12-23 DIAGNOSIS — M84375A Stress fracture, left foot, initial encounter for fracture: Secondary | ICD-10-CM | POA: Diagnosis not present

## 2019-12-23 NOTE — Progress Notes (Signed)
Subjective:   Patient ID: Jason Chavez, male   DOB: 59 y.o.   MRN: [id number]   HPI Patient states I am improved and I am planning on going to [place] in 4 weeks and hopefully I can.  States he still gets soreness and swelling and wants to have it checked   ROS      Objective:  Physical Exam  Neurovascular status intact with forefoot edema left with a breaking metatarsal deformity with fluid and swelling against the third metatarsal shaft.     Assessment:  Strong probability for stress fracture third metatarsal left with breaking metatarsal deformity     Plan:  H&P reviewed condition and x-ray and discussed fracture and continued immobilization with gradual increase in activity starting in [redacted] weeks along with ice therapy topical medications oral anti-inflammatories.  Patient is encouraged to call with questions and I explained the chances for a subsequent rupture and patient will be seen back as needed  X-ray indicates there is a fracture of the third metatarsal midshaft left

## 2021-01-02 ENCOUNTER — Other Ambulatory Visit: Payer: Self-pay | Admitting: Internal Medicine

## 2021-01-02 DIAGNOSIS — E785 Hyperlipidemia, unspecified: Secondary | ICD-10-CM

## 2021-02-15 ENCOUNTER — Ambulatory Visit (INDEPENDENT_AMBULATORY_CARE_PROVIDER_SITE_OTHER): Payer: 59 | Admitting: Neurology

## 2021-02-15 ENCOUNTER — Encounter: Payer: Self-pay | Admitting: Neurology

## 2021-02-15 VITALS — BP 153/96 | HR 80 | Ht 73.0 in | Wt 215.0 lb

## 2021-02-15 DIAGNOSIS — R519 Headache, unspecified: Secondary | ICD-10-CM | POA: Diagnosis not present

## 2021-02-15 DIAGNOSIS — I10 Essential (primary) hypertension: Secondary | ICD-10-CM | POA: Diagnosis not present

## 2021-02-15 DIAGNOSIS — M2619 Other specified anomalies of jaw-cranial base relationship: Secondary | ICD-10-CM

## 2021-02-15 DIAGNOSIS — R0683 Snoring: Secondary | ICD-10-CM

## 2021-02-15 NOTE — Progress Notes (Signed)
SLEEP MEDICINE CLINIC    Provider:  Larey Seat, MD  Primary Care Physician:  Prince Solian, [place] [address]     Referring Provider: Geoffery Lyons, [address] [place],  [place] [address]   [place].          Chief Complaint according to patient   Patient presents with:     New Patient (Initial Visit)           HISTORY OF PRESENT ILLNESS:  Jason Chavez is a 60 y.o. [place] patient was seen herein a consultation requested by PCP,  on 02/15/2021 Chief concern according to patient :   Patient reports he flew back to [place] for 3 weeks and he felt good- he worked remotely from [place] for his employer, and soon before his return he developed a headache- that headache was present even after OTC medications, he saw his PCP and BP was very high- has had high BP and his previously single medication of Olmesartan has not controlled it, today BP is again high. His daughter has reported overhearing him cough and choke in his sleep, snoring loudly and there is a concern about OSA being present.  No nocturia.  Jason Chavez  has a past medical history of Hyperlipemia and Hypertension., Jet lag, new onset headaches, snoring.      Sleep relevant medical history:  no Nocturia/ no Sleep walking, no Tonsillectomy, no cervical spine injury, no TBI, but he has allergic sinusitis,  Family medical /sleep history: No other family member on CPAP with OSA, insomnia, sleep walkers.    Social history:  Patient is working in Engineer, technical sales , and lives in a household with spouse and daughter-  The patient currently works/ 8 AM -5 PM . 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 One cat.  Tobacco use-none .  ETOH use ; wine and beer, socially,  Caffeine intake in form of Coffee( 2 cups ) Soda( /) Tea ( at work - 2 days a week) or energy drinks. Regular exercise in form of walking .    Sleep habits are as follows: The patient's dinner  time is between 8.30 -9.15 PM. The patient goes to bed at 10.30  PM and continues to sleep for 6 hours, rarely wakes for  bathroom breaks.  Bedroom is cool, quiet and dark.  He had 2 days when headaches work him from sleep- just last month.  The preferred sleep position is left sided , with the support of 2 pillows. Dreams are reportedly frequent/vivid.  6  AM is the usual rise time. The patient wakes up with an alarm.  He  usually reports not feeling refreshed or restored in AM, with  new onset symptoms of morning headaches, and residual fatigue.  Naps are taken frequently, lasting from 10-25  minutes and these are more refreshing - he is good for another 2-3 hours. Review of Systems: Out of a complete 14 system review, the patient complains of only the following symptoms, and all other reviewed systems are negative.:  , BP exacerbation with headaches, has sinus disease, snoring,  How likely are you to doze in the following situations: 0 = not likely, 1 = slight chance, 2 = moderate chance, 3 = high chance   Sitting and Reading? Watching Television? Sitting inactive in a public place (theater or meeting)? As a passenger in a car for an hour without a break? Lying down in  the afternoon when circumstances permit? Sitting and talking to someone? Sitting quietly after lunch without alcohol? In a car, while stopped for a few minutes in traffic?   Total = 12/ 24 points   FSS endorsed at 10/ 63 points.    He reports one incident of waking while his breath had stopped, he felt " life leaving  his body" , out -of -body experience, and he finally woke gasping for air. .   Social History   Socioeconomic History   Marital status: Married    Spouse name: Not on file   Number of children: 1   Years of education: Not on file   Highest education level: Not on file  Occupational History   Occupation: System analyst  Tobacco Use   Smoking status: Never   Smokeless tobacco: Never  Substance and  Sexual Activity   Alcohol use: Yes    Comment: Wine and beer   Drug use: No   Sexual activity: Not on file  Other Topics Concern   Not on file  Social History Narrative   Not on file   Social Determinants of Health   Financial Resource Strain: Not on file  Food Insecurity: Not on file  Transportation Needs: Not on file  Physical Activity: Not on file  Stress: Not on file  Social Connections: Not on file    Family History  Problem Relation Age of Onset   Heart disease Father     Past Medical History:  Diagnosis Date   Hyperlipemia    Hypertension     Past Surgical History:  Procedure Laterality Date   ANAL FISSURE REPAIR       Current Outpatient Medications on File Prior to Visit  Medication Sig Dispense Refill   fluocinonide ointment (LIDEX) 0.05 % Apply topically 2 (two) times daily as needed.     meloxicam (MOBIC) 7.5 MG tablet Take 7.5 mg by mouth daily.     pantoprazole (PROTONIX) 40 MG tablet Take 40 mg by mouth daily as needed.     simvastatin (ZOCOR) 10 MG tablet Take 10 mg by mouth at bedtime.     telmisartan (MICARDIS) 80 MG tablet Take 80 mg by mouth daily.     No current facility-administered medications on file prior to visit.    No Known Allergies  Physical exam:  Today's Vitals   02/15/21 0953  BP: (!) 153/96  Pulse: 80  Weight: 215 lb (97.5 kg)  Height: 6\' 1"  (1.854 m)   Body mass index is 28.37 kg/m.   Wt Readings from Last 3 Encounters:  02/15/21 215 lb (97.5 kg)  01/19/13 208 lb 12.8 oz (94.7 kg)  07/14/12 206 lb (93.4 kg)     Ht Readings from Last 3 Encounters:  02/15/21 6\' 1"  (1.854 m)  01/19/13 6' (1.829 m)  07/14/12 6\' 1"  (1.854 m)      General: The patient is awake, alert and appears not in acute distress. The patient is well groomed. Head: Normocephalic, atraumatic. Neck is supple. Mallampati ,  neck circumference:17.25 inches . Nasal airflow now patent.  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  Dental status:  crowded teeth,  retrognathia.  Cardiovascular:  Regular rate and cardiac rhythm by pulse,  without distended neck veins. Respiratory: Lungs are clear to auscultation.  Skin:  Without evidence of ankle edema, or rash. Trunk: The patient's posture is erect.   Neurologic exam : The patient is awake and alert, oriented to place and time.   Memory subjective described  as intact.  Attention span & concentration ability appears normal.  Speech is fluent,  without  dysarthria, dysphonia or aphasia.  Mood and affect are appropriate.   Cranial nerves: no loss of smell or taste reported  Pupils are equal and briskly reactive to light. Funduscopic exam deferred.  Extraocular movements in vertical and horizontal planes were intact and without nystagmus. No Diplopia. Visual fields by finger perimetry are intact. Hearing was intact to soft voice and finger rubbing.    Facial sensation intact to fine touch.  Facial motor strength is symmetric and tongue and uvula move midline.  Neck ROM : rotation, tilt and flexion extension were normal for age and shoulder shrug was symmetrical.    Motor exam:  Symmetric bulk, tone and ROM.   Normal tone without cog -wheeling, symmetric grip strength .   Sensory:  Fine touch and vibration were  normal.  Proprioception tested in the upper extremities was normal.   Coordination: Rapid alternating movements in the fingers/hands were of normal speed.  The Finger-to-nose maneuver was intact without evidence of ataxia, dysmetria or tremor.   Gait and station: Patient could rise unassisted from a seated position, walked without assistive device.  Stance is of normal width/ base and the patient turned with 3 steps ( RN observation) .  Toe and heel walk were deferred.  Deep tendon reflexes: in the  upper and lower extremities are symmetric and intact. 1 plus, no clonus.  Babinski response was deferred.       After spending a total time of  40  minutes face to face and additional time  for physical and neurologic examination, review of laboratory studies,  personal review of imaging studies, reports and results of other testing and review of referral information / records as far as provided in visit, I have established the following assessments:  Proteinuria, but no mentioning of hematuria, no elevated hematocrit or hemoglobin.  Labs on paper referral,   1)  Retrognathia, large tongue, Mallampati 3 plus, irregular dentition. 2)  witnessed snoring, gasping and new onset high BP causing headache.  3) some abdominal obesity.    My Plan is to proceed with:  1) HST or attended sleep study can both screen for apnea.   I would like to thank Prince Solian, MD and Geoffery Lyons, [address] [place],  [place] [address] for allowing me to meet with and to take care of this pleasant patient.   RV in 2-4 months.    Electronically signed by: Larey Seat, MD 02/15/2021 10:10 AM  [facility] Board certified by The [employer] of Sleep Medicine and Diplomate of the [employer] of Sleep Medicine. Board certified In Neurology through the [place], Fellow of the [employer] of Neurology. Medical Director of [facility].

## 2021-02-28 ENCOUNTER — Ambulatory Visit (INDEPENDENT_AMBULATORY_CARE_PROVIDER_SITE_OTHER): Payer: 59 | Admitting: Neurology

## 2021-02-28 DIAGNOSIS — G4710 Hypersomnia, unspecified: Secondary | ICD-10-CM

## 2021-02-28 DIAGNOSIS — R0683 Snoring: Secondary | ICD-10-CM

## 2021-02-28 DIAGNOSIS — I10 Essential (primary) hypertension: Secondary | ICD-10-CM

## 2021-02-28 DIAGNOSIS — G4733 Obstructive sleep apnea (adult) (pediatric): Secondary | ICD-10-CM | POA: Diagnosis not present

## 2021-02-28 DIAGNOSIS — M2619 Other specified anomalies of jaw-cranial base relationship: Secondary | ICD-10-CM

## 2021-02-28 DIAGNOSIS — R519 Headache, unspecified: Secondary | ICD-10-CM

## 2021-03-02 NOTE — Progress Notes (Signed)
[facility] TEST REPORT ( by Watch PAT)   STUDY DATE:  03-01-2021   ORDERING CLINICIAN: Larey Seat, MD  REFERRING CLINICIAN: NP for Dr Avva , [place].   CLINICAL INFORMATION/HISTORY: Jason Chavez is a 60 y.o. [place] patient who was seen here in a consultation requested by PCP office, on 02/15/2021 . Chief concern according to patient :  Jet lag, new onset headaches, snoring. Patient reports he flew back to [place] for 3 weeks and he felt good- he worked remotely from [place] for his employer, and soon before his return he developed a headache- that headache was present even after OTC medications, he saw his PCP and BP was very high- has had high BP and his previously single medication of Olmesartan has not controlled it, today BP is again high. His daughter has reported overhearing him cough and choke in his sleep, snoring loudly and there is a concern about OSA being present. No nocturia. Jason Chavez  has a past medical history of Hyperlipemia and Hypertension.,       Epworth sleepiness score: 10/24.   BMI: 28.6kg/m   Neck Circumference: 17"   FINDINGS:   Sleep Summary:   Total Recording Time (hours, min):   Total recording time for this home sleep test amounted to 9 hours and 6 minutes of which 8 hours and 22 minutes was a total sleep time with 37.7% estimated REM sleep.                                   Respiratory Indices:   Calculated pAHI (per hour): Overall apnea-hypopnea index was 17.8 and during REM sleep exacerbated to 34.3/h in non-REM sleep only 7.7/h.  Positional dependence of sleep apnea was also noted in supine sleep AHI was 29.8 and on the left side 8.8/h.  Snoring level was average with a mean volume of 41 dB and accompanied about 33% of total sleep time.                                                                   Oxygen Saturation Statistics:   O2 Saturation Range (%):   Oxygen  saturation ranged between a nadir of 74% and a maximum of 98%.  Mean heart rate was 94%.                                    O2 Saturation (minutes) <89%:    10.4 minutes, equivalent to 2.1% of total sleep time       Pulse Rate Statistics:           Pulse Range:    Varied between 44 and 94 bpm with a mean heart rate of 53 bpm.  Please note that this home sleep test can only give heart rate data but does not evaluate cardiac rhythm.             IMPRESSION:  This HST confirms the presence of mild to moderate sleep apnea with strong REM sleep dependence and also accentuated by sleep position, non-REM  sleep apnea and nonsupine sleep position are regularized by an apnea hypopnea index of 10 or below.   RECOMMENDATION: REM dependent apnea usually responds best to CPAP.  If the patient is willing we will try an auto titration CPAP with a setting between 6 and 16 cmH2O, 2 cm EPR, heated humidification and an interface of his choice but needs to be fitted to him.  If the patient is unhappy with the mechanics of the device or with the mask fit, he has to contact the DME within 30 days of receiving equipment.  The patient is advised to avoid supine sleep.  Left lateral sleep position was associated with an AHI of only 8.8.    INTERPRETING PHYSICIAN:   Larey Seat, MD   Medical Director of [facility] at [facility].

## 2021-03-10 ENCOUNTER — Ambulatory Visit
Admission: RE | Admit: 2021-03-10 | Discharge: 2021-03-10 | Disposition: A | Discharge status: Home / Self Care | Payer: No Typology Code available for payment source | Source: Ambulatory Visit | Attending: Internal Medicine | Admitting: Internal Medicine

## 2021-03-10 DIAGNOSIS — E785 Hyperlipidemia, unspecified: Secondary | ICD-10-CM

## 2021-03-13 DIAGNOSIS — G4710 Hypersomnia, unspecified: Secondary | ICD-10-CM | POA: Insufficient documentation

## 2021-03-13 NOTE — Addendum Note (Signed)
Addended by: Larey Seat on: 03/13/2021 04:58 PM   Modules accepted: Orders

## 2021-03-13 NOTE — Progress Notes (Signed)
IMPRESSION:  This HST confirms the presence of mild to moderate sleep apnea with strong REM sleep dependence and also accentuated by sleep position, non-REM sleep apnea and nonsupine sleep position are regularized by an apnea hypopnea index of 10 or below.   A dental device would not address REM sleep dependent apnea.   RECOMMENDATION: REM dependent apnea usually responds best to CPAP.  If the patient is willing we will try an auto titration CPAP with a setting between 6 and 16 cmH2O, 2 cm EPR, heated humidification and an interface of his choice but needs to be fitted to him.  If the patient is unhappy with the mechanics of the device or with the mask fit, he has to contact the DME within 30 days of receiving equipment.  The patient is advised to avoid supine sleep.  Left lateral sleep position was associated with an AHI of only 8.8.  Larey Seat, MD

## 2021-03-13 NOTE — Procedures (Signed)
[facility] TEST REPORT ( by Watch PAT)   STUDY DATE:  03-01-2021   ORDERING CLINICIAN: Larey Seat, MD  REFERRING CLINICIAN: NP for Dr Avva , [place].   CLINICAL INFORMATION/HISTORY: Jason Chavez is a 61 y.o. [place] patient who was seen here in a consultation requested by PCP office, on 02/15/2021 . Chief concern according to patient :  Jet lag, new onset headaches, snoring. Patient reports he flew back to [place] for 3 weeks and he felt good- he worked remotely from [place] for his employer, and soon before his return he developed a headache- that headache was present even after OTC medications, he saw his PCP and BP was very high- has had high BP and his previously single medication of Olmesartan has not controlled it, today BP is again high. His daughter has reported overhearing him cough and choke in his sleep, snoring loudly and there is a concern about OSA being present. No nocturia. Jason Chavez  has a past medical history of Hyperlipemia and Hypertension.,       Epworth sleepiness score: 10/24.   BMI: 28.6kg/m   Neck Circumference: 17", Retrognathia is present.    FINDINGS:   Sleep Summary:   Total Recording Time (hours, min):   Total recording time for this home sleep test amounted to 9 hours and 6 minutes of which 8 hours and 22 minutes was a total sleep time with 37.7% estimated REM sleep.                                   Respiratory Indices:   Calculated pAHI (per hour): Overall apnea-hypopnea index was 17.8 and during REM sleep exacerbated to 34.3/h in non-REM sleep only 7.7/h.  Positional dependence of sleep apnea was also noted in supine sleep AHI was 29.8 and on the left side 8.8/h.  Snoring level was average with a mean volume of 41 dB and accompanied about 33% of total sleep time.                                                                   Oxygen Saturation Statistics:   O2 Saturation  Range (%):   Oxygen saturation ranged between a nadir of 74% and a maximum of 98%.  Mean heart rate was 94%.                                    O2 Saturation (minutes) <89%:    10.4 minutes, equivalent to 2.1% of total sleep time       Pulse Rate Statistics:           Pulse Range:    Varied between 44 and 94 bpm with a mean heart rate of 53 bpm.  Please note that this home sleep test can only give heart rate data but does not evaluate cardiac rhythm.             IMPRESSION:  This HST confirms the presence of mild to moderate sleep apnea with strong REM sleep dependence and also accentuated by sleep position,  non-REM sleep apnea and nonsupine sleep position are regularized by an apnea hypopnea index of 10 or below.   A dental device would not address REM sleep dependent apnea.    RECOMMENDATION: REM dependent apnea usually responds best to CPAP.  If the patient is willing we will try an auto titration CPAP with a setting between 6 and 16 cmH2O, 2 cm EPR, heated humidification and an interface of his choice but needs to be fitted to him.  If the patient is unhappy with the mechanics of the device or with the mask fit, he has to contact the DME within 30 days of receiving equipment.  The patient is advised to avoid supine sleep.  Left lateral sleep position was associated with an AHI of only 8.8.                               INTERPRETING PHYSICIAN:   Larey Seat, MD   Medical Director of [facility] at [facility].

## 2021-03-14 ENCOUNTER — Telehealth: Payer: Self-pay | Admitting: Neurology

## 2021-03-14 NOTE — Telephone Encounter (Signed)
-----   Message from Larey Seat, MD sent at 03/13/2021  4:57 PM EST ----- IMPRESSION:  This HST confirms the presence of mild to moderate sleep apnea with strong REM sleep dependence and also accentuated by sleep position, non-REM sleep apnea and nonsupine sleep position are regularized by an apnea hypopnea index of 10 or below.   A dental device would not address REM sleep dependent apnea.   RECOMMENDATION: REM dependent apnea usually responds best to CPAP.  If the patient is willing we will try an auto titration CPAP with a setting between 6 and 16 cmH2O, 2 cm EPR, heated humidification and an interface of his choice but needs to be fitted to him.  If the patient is unhappy with the mechanics of the device or with the mask fit, he has to contact the DME within 30 days of receiving equipment.  The patient is advised to avoid supine sleep.  Left lateral sleep position was associated with an AHI of only 8.8.  Larey Seat, MD

## 2021-03-14 NOTE — Telephone Encounter (Signed)
I called pt. I advised pt that Dr. Brett Fairy reviewed their sleep study results and found that pt has moderate to severe sleep apnea. Dr. Brett Fairy recommends that pt starts auto CPAP. I reviewed PAP compliance expectations with the pt. Pt is agreeable to starting a CPAP. I advised pt that an order will be sent to a DME, [facility] , and [facility] will call the pt within about one week after they file with the pt's insurance. [facility] will show the pt how to use the machine, fit for masks, and troubleshoot the CPAP if needed. A follow up appt was made for insurance purposes with Dr. Brett Fairy on April 10,2023 at 2:30 pm. Pt verbalized understanding to arrive 15 minutes early and bring their CPAP. A letter with all of this information in it will be mailed to the pt as a reminder. I verified with the pt that the address we have on file is correct. Pt verbalized understanding of results. Pt had no questions at this time but was encouraged to call back if questions arise. I have sent the order to [facility] and have received confirmation that they have received the order.

## 2021-03-23 ENCOUNTER — Telehealth: Payer: Self-pay | Admitting: *Deleted

## 2021-03-23 NOTE — Telephone Encounter (Signed)
Pt sleep report mailed on 03/23/21 to pt home address.

## 2021-06-06 ENCOUNTER — Encounter: Payer: Self-pay | Admitting: Neurology

## 2021-06-12 ENCOUNTER — Encounter: Payer: Self-pay | Admitting: Neurology

## 2021-06-12 ENCOUNTER — Ambulatory Visit (INDEPENDENT_AMBULATORY_CARE_PROVIDER_SITE_OTHER): Payer: 59 | Admitting: Neurology

## 2021-06-12 VITALS — BP 162/89 | HR 80 | Ht 73.0 in | Wt 219.0 lb

## 2021-06-12 DIAGNOSIS — R0683 Snoring: Secondary | ICD-10-CM | POA: Diagnosis not present

## 2021-06-12 DIAGNOSIS — G4710 Hypersomnia, unspecified: Secondary | ICD-10-CM

## 2021-06-12 DIAGNOSIS — R519 Headache, unspecified: Secondary | ICD-10-CM

## 2021-06-12 DIAGNOSIS — M2619 Other specified anomalies of jaw-cranial base relationship: Secondary | ICD-10-CM

## 2021-06-12 DIAGNOSIS — G4730 Sleep apnea, unspecified: Secondary | ICD-10-CM

## 2021-06-12 DIAGNOSIS — I10 Essential (primary) hypertension: Secondary | ICD-10-CM

## 2021-06-12 NOTE — Progress Notes (Signed)
? ? ?SLEEP MEDICINE CLINIC ?  ? ?Provider:  Larey Seat, MD  ?Primary Care Physician:  Prince Solian, MD ?[address] ?[place] [address]  ? ?  ?Referring Provider: Prince Solian, Md ?[address] ?[place],  [place] [address]  ? ?[place].  ?  ?  ?    ?Chief Complaint according to patient   ?Patient presents with:  ?  ? New Patient (Initial Visit on CPAP )  ?   Here for initial CPAP f/u. Pt reports doing well on CPAP. It has helped his acid reflux. Sleeping throughout the night. Pt waking up well rested.   ?  ?  ?HISTORY OF PRESENT ILLNESS:  ? ?06-12-2021.  ?Jason Chavez is a 61 y.o. [place] patient was seen on 06/12/2021 in hs first RV after sleep study.  ? ?I am very happy to hear the patient experiences  better sleep. He underwent a home sleep test on 12-28 2022 and he had a very strong REM sleep exacerbation of his apnea hypopnea index the overall index was 17.8 but during REM sleep it became 34.3/h.  In supine sleep he also had much more apnea than when sleeping on his left side.  He was snoring.  He had 10 minutes of hypoxia during this home sleep test.  I ordered a CPAP with a setting between 6 and 16 cmH2O pressure along with 2 cm EPR.  He is using a nasal CPAP interface and it is working well for him he reports that he feels that his sleep is more restorative and refreshing and he feels just better overall.  He also reports that his blood pressure has decreased with better sleep. ?His compliance has been excellent 100% 33 of the last 33 days.   ?He is using on average CPAP for 7 hours 6 minutes.  His average leak is very low 3.7 L/min and his residual apnea-hypopnea index is 0.2/h.  This is excellent no central apneas are arising and his 95th percentile pressure is 8.2 cmH2O which she does never exceed and March.  ? He does not have to go to the bathroom frequently at night.  And he wakes up almost in the same position he fell asleep and there is no restless  tossing and turning.  His new Epworth sleepiness score is now 2 points.  And he is overall very happy with his Luna CPAP machine. ? ? ? ? ?CONSULT : Chief concern according to patient :   ?Patient reports he flew back to [place] for 3 weeks and he felt good- he worked remotely from [place] for his employer, and soon before his return he developed a headache- that headache was present even after OTC medications, he saw his PCP and BP was very high- has had high BP and his previously single medication of Olmesartan has not controlled it, today BP is again high. His daughter has reported overhearing him cough and choke in his sleep, snoring loudly and there is a concern about OSA being present.  ?No nocturia.  ?Jason Chavez  has a past medical history of Hyperlipemia and Hypertension., Jet lag, new onset headaches, snoring.  ?  ?  ?Sleep relevant medical history:  no Nocturia/ no Sleep walking, no Tonsillectomy, no cervical spine injury, no TBI, but he has allergic sinusitis,  Family medical /sleep history: No other family member on CPAP with OSA, insomnia, sleep walkers.  ?  ?Social history:  Patient is working in Engineer, technical sales , and lives in  a household with spouse and daughter-  The patient currently works/ 8 AM -5 PM . 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 One cat. ? ?Tobacco use-none .  ETOH use ; wine and beer, socially,  ?Caffeine intake in form of Coffee( 2 cups ) Soda( /) Tea ( at work - 2 days a week) or energy drinks. ?Regular exercise in form of walking .   ? ?Sleep habits are as follows: The patient's dinner time is between 8.30 -9.15 PM. The patient goes to bed at 10.30  PM and continues to sleep for 6 hours, rarely wakes for  bathroom breaks.  Bedroom is cool, quiet and dark.  ?He had 2 days when headaches work him from sleep- just last month.  ?The preferred sleep position is left sided , with the support of 2 pillows. Dreams are reportedly frequent/vivid.  ?6  AM is the usual rise time. The patient  wakes up with an alarm.  ?He  usually reports not feeling refreshed or restored in AM, with  new onset symptoms of morning headaches, and residual fatigue. ? Naps are taken frequently, lasting from 10-25  minutes and these are more refreshing - he is good for another 2-3 hours. Review of Systems: ?Out of a complete 14 system review, the patient complains of only the following symptoms, and all other reviewed systems are negative.:  ?, BP exacerbation with headaches, has sinus disease, snoring,  ?How likely are you to doze in the following situations: ?0 = not likely, 1 = slight chance, 2 = moderate chance, 3 = high chance ?  ?Sitting and Reading? ?Watching Television? ?Sitting inactive in a public place (theater or meeting)? ?As a passenger in a car for an hour without a break? ?Lying down in the afternoon when circumstances permit? ?Sitting and talking to someone? ?Sitting quietly after lunch without alcohol? ?In a car, while stopped for a few minutes in traffic? ?  ?Total = on CPAP 2 points, down from 12/ 24 points  ? FSS endorsed at 10/ 63 points.  ? ?No nocturia.  ?No headaches.  ?Lower blood pressure!  ? ?He reports one incident of waking while his breath had stopped, he felt " life leaving  his body" , out -of -body experience, and he finally woke gasping for air. .  ? ?Social History  ? ?Socioeconomic History  ? Marital status: Married  ?  Spouse name: Not on file  ? Number of children: 1  ? Years of education: Not on file  ? Highest education level: Not on file  ?Occupational History  ? Occupation: System analyst  ?Tobacco Use  ? Smoking status: Never  ? Smokeless tobacco: Never  ?Substance and Sexual Activity  ? Alcohol use: Yes  ?  Comment: Wine and beer  ? Drug use: No  ? Sexual activity: Not on file  ?Other Topics Concern  ? Not on file  ?Social History Narrative  ? Not on file  ? ?Social Determinants of Health  ? ?Financial Resource Strain: Not on file  ?Food Insecurity: Not on file  ?Transportation  Needs: Not on file  ?Physical Activity: Not on file  ?Stress: Not on file  ?Social Connections: Not on file  ? ? ?Family History  ?Problem Relation Age of Onset  ? Heart disease Father   ? ? ?Past Medical History:  ?Diagnosis Date  ? Hyperlipemia   ? Hypertension   ? ? ?Past Surgical History:  ?Procedure Laterality Date  ?  ANAL FISSURE REPAIR    ?  ? ?Current Outpatient Medications on File Prior to Visit  ?Medication Sig Dispense Refill  ? fluocinonide ointment (LIDEX) 0.05 % Apply topically 2 (two) times daily as needed.    ? pantoprazole (PROTONIX) 40 MG tablet Take 40 mg by mouth daily as needed.    ? rosuvastatin (CRESTOR) 10 MG tablet Take 10 mg by mouth at bedtime.    ? ?No current facility-administered medications on file prior to visit.  ? ? ?No Known Allergies ? ?Physical exam: ? ?Today's Vitals  ? 06/12/21 1454  ?BP: (!) 162/89  ?Pulse: 80  ?Weight: 219 lb (99.3 kg)  ?Height: '6\' 1"'$  (1.854 m)  ? ?Body mass index is 28.89 kg/m?.  ? ?Wt Readings from Last 3 Encounters:  ?06/12/21 219 lb (99.3 kg)  ?02/15/21 215 lb (97.5 kg)  ?01/19/13 208 lb 12.8 oz (94.7 kg)  ?  ? ?Ht Readings from Last 3 Encounters:  ?06/12/21 '6\' 1"'$  (1.854 m)  ?02/15/21 '6\' 1"'$  (1.854 m)  ?01/19/13 6' (1.829 m)  ?  ?  ?General: The patient is awake, alert and appears not in acute distress. The patient is well groomed. ?Head: Normocephalic, atraumatic. Neck is supple. Mallampati ,  ?neck circumference:17.25 inches . Nasal airflow now patent.  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  ?Dental status:  crowded teeth, retrognathia.  ?Cardiovascular:  Regular rate and cardiac rhythm by pulse,  without distended neck veins. ?Respiratory: Lungs are clear to auscultation.  ?Skin:  Without evidence of ankle edema, or rash. ?Trunk: The patient's posture is erect. ?  ?Neurologic exam : ?The patient is awake and alert, oriented to place and time.   ?Memory subjective described as intact.  ?Attention span & concentration ability appears normal.  ?Speech is fluent,   without  dysarthria, dysphonia or aphasia.  ?Mood and affect are appropriate. ?  ?Cranial nerves: no loss of smell or taste reported  ?Pupils are equal and briskly reactive to light. Funduscopic exam de

## 2021-06-12 NOTE — Patient Instructions (Signed)
Sleep Apnea ?Sleep apnea affects breathing during sleep. It causes breathing to stop for 10 seconds or more, or to become shallow. People with sleep apnea usually snore loudly. ?It can also increase the risk of: ?Heart attack. ?Stroke. ?Being very overweight (obese). ?Diabetes. ?Heart failure. ?Irregular heartbeat. ?High blood pressure. ?The goal of treatment is to help you breathe normally again. ?What are the causes? ?The most common cause of this condition is a collapsed or blocked airway. ?There are three kinds of sleep apnea: ?Obstructive sleep apnea. This is caused by a blocked or collapsed airway. ?Central sleep apnea. This happens when the brain does not send the right signals to the muscles that control breathing. ?Mixed sleep apnea. This is a combination of obstructive and central sleep apnea. ?What increases the risk? ?Being overweight. ?Smoking. ?Having a small airway. ?Being older. ?Being male. ?Drinking alcohol. ?Taking medicines to calm yourself (sedatives or tranquilizers). ?Having family members with the condition. ?Having a tongue or tonsils that are larger than normal. ?What are the signs or symptoms? ?Trouble staying asleep. ?Loud snoring. ?Headaches in the morning. ?Waking up gasping. ?Dry mouth or sore throat in the morning. ?Being sleepy or tired during the day. ?If you are sleepy or tired during the day, you may also: ?Not be able to focus your mind (concentrate). ?Forget things. ?Get angry a lot and have mood swings. ?Feel sad (depressed). ?Have changes in your personality. ?Have less interest in sex, if you are male. ?Be unable to have an erection, if you are male. ?How is this treated? ? ?Sleeping on your side. ?Using a medicine to get rid of mucus in your nose (decongestant). ?Avoiding the use of alcohol, medicines to help you relax, or certain pain medicines (narcotics). ?Losing weight, if needed. ?Changing your diet. ?Quitting smoking. ?Using a machine to open your airway while you  sleep, such as: ?An oral appliance. This is a mouthpiece that shifts your lower jaw forward. ?A CPAP device. This device blows air through a mask when you breathe out (exhale). ?An EPAP device. This has valves that you put in each nostril. ?A BIPAP device. This device blows air through a mask when you breathe in (inhale) and breathe out. ?Having surgery if other treatments do not work. ?Follow these instructions at home: ?Lifestyle ?Make changes that your doctor recommends. ?Eat a healthy diet. ?Lose weight if needed. ?Avoid alcohol, medicines to help you relax, and some pain medicines. ?Do not smoke or use any products that contain nicotine or tobacco. If you need help quitting, ask your doctor. ?General instructions ?Take over-the-counter and prescription medicines only as told by your doctor. ?If you were given a machine to use while you sleep, use it only as told by your doctor. ?If you are having surgery, make sure to tell your doctor you have sleep apnea. You may need to bring your device with you. ?Keep all follow-up visits. ?Contact a doctor if: ?The machine that you were given to use during sleep bothers you or does not seem to be working. ?You do not get better. ?You get worse. ?Get help right away if: ?Your chest hurts. ?You have trouble breathing in enough air. ?You have an uncomfortable feeling in your back, arms, or stomach. ?You have trouble talking. ?One side of your body feels weak. ?A part of your face is hanging down. ?These symptoms may be an emergency. Get help right away. Call your local emergency services (911 in the [place] ?Do not wait to see if the symptoms   will go away. ?Do not drive yourself to the hospital. ?Summary ?This condition affects breathing during sleep. ?The most common cause is a collapsed or blocked airway. ?The goal of treatment is to help you breathe normally while you sleep. ?This information is not intended to replace advice given to you by your health care provider. Make  sure you discuss any questions you have with your health care provider. ?Document Revised: 09/28/2020 Document Reviewed: 01/29/2020 ?Elsevier Patient Education ? 2022 Elsevier Inc. ? ?

## 2021-06-27 NOTE — Progress Notes (Signed)
Excellent compliance on CPAP.

## 2021-11-17 ENCOUNTER — Telehealth: Payer: Self-pay | Admitting: Neurology

## 2021-11-17 ENCOUNTER — Encounter: Payer: Self-pay | Admitting: Neurology

## 2021-11-17 NOTE — Telephone Encounter (Signed)
Letter sent to patient.

## 2021-11-17 NOTE — Telephone Encounter (Signed)
Pt states he is traveling to [place] and he will og thru [facility] in [place] which agents are not familiar with the CPAP machine and the purpose of it.  Pt is asking if a letter can be prepared stating the purpose of the CPAP and why pt must take it with him on flight.  Pt made aware the office is not open today.  Pt informed that even with request being submitted to [facility] there is no guarantee that the provider can submit a letter to him on my chart based on time allowed for letter request.  Pt fully understands that and is thankful for the message being sent for an attempt to assist him.  Message being sent as high priority to [facility]

## 2021-11-20 ENCOUNTER — Encounter: Payer: Self-pay | Admitting: Neurology

## 2021-11-20 NOTE — Progress Notes (Signed)
September 18th, 2023. [place] , [place]  To whom it may concern,  My patient Mr. Jason Chavez, Date of birth 06-May-1960, needs a positive airway pressure machine for the treatment of sleep apnea and will carry this machine to use  on board of his long distance flight .  He asked for clarification in case the carry-on would be questioned at the airport.     Sincerely,  Larey Seat, MD   [facility]

## 2021-11-20 NOTE — Telephone Encounter (Signed)
September 18th, 2023. [place] , [place]   To whom it may concern,   My patient Mr. Jason Chavez, Date of birth 1960/04/23, needs a positive airway pressure machine for the treatment of sleep apnea and will carry this machine to use  on board of his long distance flight .  He asked for clarification in case the carry-on would be questioned at the airport.       Sincerely,  Larey Seat, MD    [facility]

## 2022-06-06 ENCOUNTER — Encounter: Payer: Self-pay | Admitting: Internal Medicine

## 2022-06-12 NOTE — Progress Notes (Unsigned)
PATIENT: Jason Chavez DOB: 07-Apr-1960  REASON FOR VISIT: follow up HISTORY FROM: patient PRIMARY NEUROLOGIST: Dr.Dohmeier  Chief Complaint  Patient presents with   Follow-up    Pt in 5 Pt here for CPAP f/u Pt states  his blood oxygen  rate drops to 88% during the night      HISTORY OF PRESENT ILLNESS: Today 06/13/22:  Jason Chavez is a 62 y.o. male with a history of OSA on CPAP. Returns today for follow-up.  He reports that the CPAP is working well for him.  He states that he does not like to sleep without it.  He has noticed that on his phone app his oxygen level drops down to 88%.  He denies any new symptoms.  Returns today for evaluation.       REVIEW OF SYSTEMS: Out of a complete 14 system review of symptoms, the patient complains only of the following symptoms, and all other reviewed systems are negative.  FSS ESS  ALLERGIES: No Known Allergies  HOME MEDICATIONS: Outpatient Medications Prior to Visit  Medication Sig Dispense Refill   fluocinonide ointment (LIDEX) 0.05 % Apply topically 2 (two) times daily as needed.     pantoprazole (PROTONIX) 40 MG tablet Take 40 mg by mouth daily as needed.     rosuvastatin (CRESTOR) 10 MG tablet Take 10 mg by mouth at bedtime.     No facility-administered medications prior to visit.    PAST MEDICAL HISTORY: Past Medical History:  Diagnosis Date   Hyperlipemia    Hypertension     PAST SURGICAL HISTORY: Past Surgical History:  Procedure Laterality Date   ANAL FISSURE REPAIR      FAMILY HISTORY: Family History  Problem Relation Age of Onset   Heart disease Father     SOCIAL HISTORY: Social History   Socioeconomic History   Marital status: Married    Spouse name: Not on file   Number of children: 1   Years of education: Not on file   Highest education level: Not on file  Occupational History   Occupation: System analyst  Tobacco Use   Smoking status: Never   Smokeless tobacco: Never   Substance and Sexual Activity   Alcohol use: Yes    Comment: Wine and beer   Drug use: No   Sexual activity: Not on file  Other Topics Concern   Not on file  Social History Narrative   Not on file   Social Determinants of Health   Financial Resource Strain: Not on file  Food Insecurity: Not on file  Transportation Needs: Not on file  Physical Activity: Not on file  Stress: Not on file  Social Connections: Not on file  Intimate Partner Violence: Not on file      PHYSICAL EXAM  Vitals:   06/13/22 1013  BP: (!) 154/90  Pulse: 75  Weight: 221 lb (100.2 kg)  Height: 6\' 1"  (1.854 m)   Body mass index is 29.16 kg/m.  Generalized: Well developed, in no acute distress  Chest: Lungs clear to auscultation bilaterally  Neurological examination  Mentation: Alert oriented to time, place, history taking. Follows all commands speech and language fluent Cranial nerve II-XII: Facial symmetry noted   DIAGNOSTIC DATA (LABS, IMAGING, TESTING) - I reviewed patient records, labs, notes, testing and imaging myself where available.  No results found for: "WBC", "HGB", "HCT", "MCV", "PLT"    Component Value Date/Time   NA 138 02/28/2006 0906   K 4.6 02/28/2006 0906  CL 103 02/28/2006 0906   CO2 27 02/28/2006 0906   GLUCOSE 88 02/28/2006 0906   BUN 10 02/28/2006 0906   CREATININE 1.0 02/28/2006 0906   CALCIUM 9.7 02/28/2006 0906   PROT 7.8 06/03/2007 0813   ALBUMIN 4.1 06/03/2007 0813   AST 28 06/03/2007 0813   ALT 27 06/03/2007 0813   ALKPHOS 61 06/03/2007 0813   BILITOT 1.4 (H) 06/03/2007 0813   GFRNONAA 85 02/28/2006 0906   Lab Results  Component Value Date   CHOL 125 06/03/2007   HDL 28.9 (L) 06/03/2007   LDLCALC 66 06/03/2007   LDLDIRECT 175.1 02/28/2006   TRIG 152 (H) 06/03/2007   CHOLHDL 4.3 CALC 06/03/2007      ASSESSMENT AND PLAN 62 y.o. year old male  has a past medical history of Hyperlipemia and Hypertension. here with:  OSA on CPAP  - CPAP  compliance excellent - Good treatment of AHI  - Encourage patient to use CPAP nightly and > 4 hours each night -Ordered overnight pulse oximetry to be worn with CPAP to evaluate oxygen status at night - F/U in 1 year or sooner if needed   Butch Penny, MSN, NP-C 06/13/2022, 10:09 AM [facility] [address], [address] [place], [place] [address] [phone]

## 2022-06-13 ENCOUNTER — Ambulatory Visit (INDEPENDENT_AMBULATORY_CARE_PROVIDER_SITE_OTHER): Payer: 59 | Admitting: Adult Health

## 2022-06-13 ENCOUNTER — Encounter: Payer: Self-pay | Admitting: Adult Health

## 2022-06-13 VITALS — BP 154/90 | HR 75 | Ht 73.0 in | Wt 221.0 lb

## 2022-06-13 DIAGNOSIS — G4733 Obstructive sleep apnea (adult) (pediatric): Secondary | ICD-10-CM

## 2022-06-13 DIAGNOSIS — G4710 Hypersomnia, unspecified: Secondary | ICD-10-CM

## 2022-06-13 DIAGNOSIS — G4730 Sleep apnea, unspecified: Secondary | ICD-10-CM | POA: Diagnosis not present

## 2022-06-14 NOTE — Progress Notes (Signed)
Sharyne Richters, RN; [place], Megan Salon, [place]; Marveen Reeks; Felipa Furnace, Elease Hashimoto Received, Thanks!     Previous Messages    ----- Message ----- From: Guy Begin, RN Sent: 06/14/2022  10:27 AM EDT To: Henderson Newcomer; Kathyrn Sheriff; Santina Evans; * Subject: ONO wearing cpap machine                      Order in [facility] for ONO wearing cpap on this pt  Jason Chavez Male, 62 y.o., [birth year] MRN: [id number]   Thanks  Andrey Campanile RN

## 2022-07-18 ENCOUNTER — Ambulatory Visit (AMBULATORY_SURGERY_CENTER): Payer: 59 | Admitting: *Deleted

## 2022-07-18 VITALS — Ht 73.0 in | Wt 216.0 lb

## 2022-07-18 DIAGNOSIS — Z86010 Personal history of colonic polyps: Secondary | ICD-10-CM

## 2022-07-18 MED ORDER — NA SULFATE-K SULFATE-MG SULF 17.5-3.13-1.6 GM/177ML PO SOLN
1.0000 | Freq: Once | ORAL | 0 refills | Status: AC
Start: 2022-07-18 — End: 2022-07-18

## 2022-07-18 NOTE — Progress Notes (Signed)

## 2022-07-23 ENCOUNTER — Telehealth: Payer: Self-pay | Admitting: Internal Medicine

## 2022-07-23 NOTE — Telephone Encounter (Signed)
Patient needing assistance with prep medication. Please advise.

## 2022-07-23 NOTE — Telephone Encounter (Signed)
Spoke with pt and reviewed how to find instructions on my chart the website to obtain coupon for Bowel prep and a copy of instructions are being sent to his verified home address. Sent pt to facilitate questions regarding insurance.

## 2022-08-06 ENCOUNTER — Encounter: Payer: Self-pay | Admitting: Internal Medicine

## 2022-08-15 ENCOUNTER — Encounter: Payer: Self-pay | Admitting: Internal Medicine

## 2022-08-15 ENCOUNTER — Ambulatory Visit (AMBULATORY_SURGERY_CENTER): Payer: 59 | Admitting: Internal Medicine

## 2022-08-15 VITALS — BP 108/75 | HR 57 | Temp 98.0°F | Resp 15 | Ht 73.0 in | Wt 216.0 lb

## 2022-08-15 DIAGNOSIS — Z09 Encounter for follow-up examination after completed treatment for conditions other than malignant neoplasm: Secondary | ICD-10-CM

## 2022-08-15 DIAGNOSIS — K648 Other hemorrhoids: Secondary | ICD-10-CM

## 2022-08-15 DIAGNOSIS — D123 Benign neoplasm of transverse colon: Secondary | ICD-10-CM

## 2022-08-15 DIAGNOSIS — Z86010 Personal history of colonic polyps: Secondary | ICD-10-CM

## 2022-08-15 DIAGNOSIS — Z1211 Encounter for screening for malignant neoplasm of colon: Secondary | ICD-10-CM

## 2022-08-15 DIAGNOSIS — K5730 Diverticulosis of large intestine without perforation or abscess without bleeding: Secondary | ICD-10-CM | POA: Diagnosis not present

## 2022-08-15 DIAGNOSIS — D122 Benign neoplasm of ascending colon: Secondary | ICD-10-CM

## 2022-08-15 DIAGNOSIS — K635 Polyp of colon: Secondary | ICD-10-CM

## 2022-08-15 DIAGNOSIS — D124 Benign neoplasm of descending colon: Secondary | ICD-10-CM

## 2022-08-15 MED ORDER — SODIUM CHLORIDE 0.9 % IV SOLN: 500.0000 mL | Freq: Once | INTRAVENOUS | Status: DC

## 2022-08-15 NOTE — Op Note (Signed)
[facility] Patient Name: Jason Chavez Procedure Date: 08/15/2022 9:43 AM MRN: [id number] Endoscopist: Beverley Fiedler , MD, [id number] Age: 62 Referring MD:  Date of Birth: 1960/07/03 Gender: Male Account #: [id number] Procedure:                Colonoscopy Indications:              High risk colon cancer surveillance: Personal                            history of multiple adenomas, Last colonoscopy:                            2019 (outside GI practice), adenoma x 2 here in 2014 Medicines:                Monitored Anesthesia Care Procedure:                Pre-Anesthesia Assessment:                           - Prior to the procedure, a History and Physical                            was performed, and patient medications and                            allergies were reviewed. The patient's tolerance of                            previous anesthesia was also reviewed. The risks                            and benefits of the procedure and the sedation                            options and risks were discussed with the patient.                            All questions were answered, and informed consent                            was obtained. Prior Anticoagulants: The patient has                            taken no anticoagulant or antiplatelet agents. ASA                            Grade Assessment: II - A patient with mild systemic                            disease. After reviewing the risks and benefits,                            the patient was deemed in satisfactory condition to  undergo the procedure.                           After obtaining informed consent, the colonoscope                            was passed under direct vision. Throughout the                            procedure, the patient's blood pressure, pulse, and                            oxygen saturations were monitored continuously. The                            Olympus  CF-HQ190L ([id number]) Colonoscope was                            introduced through the anus and advanced to the                            cecum, identified by appendiceal orifice and                            ileocecal valve. The colonoscopy was performed                            without difficulty. The patient tolerated the                            procedure well. The quality of the bowel                            preparation was good. The ileocecal valve,                            appendiceal orifice, and rectum were photographed. Scope In: 9:48:19 AM Scope Out: 10:07:31 AM Scope Withdrawal Time: 0 hours 16 minutes 28 seconds  Total Procedure Duration: 0 hours 19 minutes 12 seconds  Findings:                 The digital rectal exam was normal.                           A 4 mm polyp was found in the ascending colon. The                            polyp was sessile. The polyp was removed with a                            cold snare. Resection and retrieval were complete.                           Two sessile polyps were found in the transverse  colon. The polyps were 3 to 5 mm in size. These                            polyps were removed with a cold snare. Resection                            and retrieval were complete.                           Two sessile polyps were found in the descending                            colon. The polyps were 2 to 3 mm in size. These                            polyps were removed with a cold snare. Resection                            and retrieval were complete.                           Multiple medium-mouthed and small-mouthed                            diverticula were found in the ascending colon and                            cecum.                           Internal hemorrhoids were found during                            retroflexion. The hemorrhoids were small. Complications:            No immediate  complications. Estimated Blood Loss:     Estimated blood loss: none. Impression:               - One 4 mm polyp in the ascending colon, removed                            with a cold snare. Resected and retrieved.                           - Two 3 to 5 mm polyps in the transverse colon,                            removed with a cold snare. Resected and retrieved.                           - Two 2 to 3 mm polyps in the descending colon,                            removed with a cold snare. Resected and  retrieved.                           - Moderate diverticulosis in the ascending colon                            and in the cecum.                           - Small internal hemorrhoids. Recommendation:           - Patient has a contact number available for                            emergencies. The signs and symptoms of potential                            delayed complications were discussed with the                            patient. Return to normal activities tomorrow.                            Written discharge instructions were provided to the                            patient.                           - Resume previous diet.                           - Continue present medications.                           - Await pathology results.                           - Repeat colonoscopy is recommended for                            surveillance. The colonoscopy date will be                            determined after pathology results from today's                            exam become available for review. Beverley Fiedler, MD 08/15/2022 10:11:56 AM This report has been signed electronically.

## 2022-08-15 NOTE — Progress Notes (Signed)
Pt's states no medical or surgical changes since previsit or office visit. 

## 2022-08-15 NOTE — Progress Notes (Signed)
Called to room to assist during endoscopic procedure.  Patient ID and intended procedure confirmed with present staff. Received instructions for my participation in the procedure from the performing physician.  

## 2022-08-15 NOTE — Progress Notes (Signed)
Vss nad trans to pacu 

## 2022-08-15 NOTE — Progress Notes (Signed)
GASTROENTEROLOGY PROCEDURE H&P NOTE   Primary Care Physician: Chilton Greathouse, MD    Reason for Procedure:  History of colon polyps  Plan:    Surveillance colonoscopy  Patient is appropriate for endoscopic procedure(s) in the ambulatory ([facility]) setting.  The nature of the procedure, as well as the risks, benefits, and alternatives were carefully and thoroughly reviewed with the patient. Ample time for discussion and questions allowed. The patient understood, was satisfied, and agreed to proceed.     HPI: Jason Chavez is a 62 y.o. male who presents for surveillance colonoscopy.  Medical history as below.  Tolerated the prep.  No recent chest pain or shortness of breath.  No abdominal pain today.  Past Medical History:  Diagnosis Date   Allergy    seasonal   Cataract    removed,left eye   GERD (gastroesophageal reflux disease)    Hyperlipemia    Hypertension    normal now after c pap updated 07/18/22   Retinal tear of left eye    Sleep apnea    wear c pap    Past Surgical History:  Procedure Laterality Date   ANAL FISSURE REPAIR     EYE SURGERY Left     Prior to Admission medications   Medication Sig Start Date End Date Taking? Authorizing Provider  fluocinonide ointment (LIDEX) 0.05 % Apply topically 2 (two) times daily as needed. 10/06/19  Yes [provider]  fluticasone (CUTIVATE) 0.05 % cream Apply topically 2 (two) times daily. 07/11/22  Yes [provider]  ketoconazole (NIZORAL) 2 % cream Apply topically 2 (two) times daily. 07/11/22  Yes [provider]  loratadine (CLARITIN) 10 MG tablet Take 10 mg by mouth daily. TAKE ONE DAILY   Yes [provider]  rosuvastatin (CRESTOR) 10 MG tablet Take 10 mg by mouth at bedtime. 06/10/21  Yes [provider]  OVER THE COUNTER MEDICATION     [provider]  pantoprazole (PROTONIX) 40 MG tablet Take 40 mg by mouth daily as needed. 10/06/19   [provider]     Current Outpatient Medications  Medication Sig Dispense Refill   fluocinonide ointment (LIDEX) 0.05 % Apply topically 2 (two) times daily as needed.     fluticasone (CUTIVATE) 0.05 % cream Apply topically 2 (two) times daily.     ketoconazole (NIZORAL) 2 % cream Apply topically 2 (two) times daily.     loratadine (CLARITIN) 10 MG tablet Take 10 mg by mouth daily. TAKE ONE DAILY     rosuvastatin (CRESTOR) 10 MG tablet Take 10 mg by mouth at bedtime.     OVER THE COUNTER MEDICATION      pantoprazole (PROTONIX) 40 MG tablet Take 40 mg by mouth daily as needed.     Current Facility-Administered Medications  Medication Dose Route Frequency Provider Last Rate Last Admin   0.9 %  sodium chloride infusion  500 mL Intravenous Once Nuala Chiles, Carie Caddy, MD        Allergies as of 08/15/2022 - Review Complete 08/15/2022  Allergen Reaction Noted   Other  07/18/2022    Family History  Problem Relation Age of Onset   Heart disease Father    Sleep apnea Neg Hx    Colon cancer Neg Hx    Colon polyps Neg Hx    Crohn's disease Neg Hx    Esophageal cancer Neg Hx    Rectal cancer Neg Hx    Stomach cancer Neg Hx    Ulcerative colitis Neg  Hx     Social History   Socioeconomic History   Marital status: Married    Spouse name: Not on file   Number of children: 1   Years of education: Not on file   Highest education level: Not on file  Occupational History   Occupation: System analyst  Tobacco Use   Smoking status: Never   Smokeless tobacco: Never  Vaping Use   Vaping Use: Never used  Substance and Sexual Activity   Alcohol use: Yes    Comment: Wine and beer-socially   Drug use: No   Sexual activity: Not on file  Other Topics Concern   Not on file  Social History Narrative   Not on file   Social Determinants of Health   Financial Resource Strain: Not on file  Food Insecurity: Not on file  Transportation Needs: Not on file  Physical Activity: Not on file  Stress: Not on file   Social Connections: Not on file  Intimate Partner Violence: Not on file    Physical Exam: Vital signs in last 24 hours: @BP  (!) 148/85   Pulse (!) 58   Temp 98 F (36.7 C)   Ht 6\' 1"  (1.854 m)   Wt 216 lb (98 kg)   SpO2 98%   BMI 28.50 kg/m  GEN: NAD EYE: Sclerae anicteric ENT: MMM CV: Non-tachycardic Pulm: CTA b/l GI: Soft, NT/ND NEURO:  Alert & Oriented x 3   Erick Blinks, MD [facility]  08/15/2022 9:39 AM

## 2022-08-15 NOTE — Patient Instructions (Signed)
Handouts given on polyps, hemorrhoids and diverticulosis.  YOU HAD AN ENDOSCOPIC PROCEDURE TODAY AT THE [facility]:   Refer to the procedure report that was given to you for any specific questions about what was found during the examination.  If the procedure report does not answer your questions, please call your gastroenterologist to clarify.  If you requested that your care partner not be given the details of your procedure findings, then the procedure report has been included in a sealed envelope for you to review at your convenience later.  YOU SHOULD EXPECT: Some feelings of bloating in the abdomen. Passage of more gas than usual.  Walking can help get rid of the air that was put into your GI tract during the procedure and reduce the bloating. If you had a lower endoscopy (such as a colonoscopy or flexible sigmoidoscopy) you may notice spotting of blood in your stool or on the toilet paper. If you underwent a bowel prep for your procedure, you may not have a normal bowel movement for a few days.  Please Note:  You might notice some irritation and congestion in your nose or some drainage.  This is from the oxygen used during your procedure.  There is no need for concern and it should clear up in a day or so.  SYMPTOMS TO REPORT IMMEDIATELY:  Following lower endoscopy (colonoscopy or flexible sigmoidoscopy):  Excessive amounts of blood in the stool  Significant tenderness or worsening of abdominal pains  Swelling of the abdomen that is new, acute  Fever of 100F or higher  For urgent or emergent issues, a gastroenterologist can be reached at any hour by calling [phone]. Do not use MyChart messaging for urgent concerns.    DIET:  We do recommend a small meal at first, but then you may proceed to your regular diet.  Drink plenty of fluids but you should avoid alcoholic beverages for 24 hours.  ACTIVITY:  You should plan to take it easy for the rest of today and you should  NOT DRIVE or use heavy machinery until tomorrow (because of the sedation medicines used during the test).    FOLLOW UP: Our staff will call the number listed on your records the next business day following your procedure.  We will call around 7:15- 8:00 am to check on you and address any questions or concerns that you may have regarding the information given to you following your procedure. If we do not reach you, we will leave a message.     If any biopsies were taken you will be contacted by phone or by letter within the next 1-3 weeks.  Please call us at [phone] if you have not heard about the biopsies in 3 weeks.    SIGNATURES/CONFIDENTIALITY: You and/or your care partner have signed paperwork which will be entered into your electronic medical record.  These signatures attest to the fact that that the information above on your After Visit Summary has been reviewed and is understood.  Full responsibility of the confidentiality of this discharge information lies with you and/or your care-partner. 

## 2022-08-16 ENCOUNTER — Telehealth: Payer: Self-pay

## 2022-08-16 NOTE — Telephone Encounter (Signed)
  Follow up Call-     08/15/2022    8:38 AM  Call back number  Post procedure Call Back phone  # [phone]  Permission to leave phone message Yes     Patient questions:  Do you have a fever, pain , or abdominal swelling? No. Pain Score  0 *  Have you tolerated food without any problems? Yes.    Have you been able to return to your normal activities? yes  Do you have any questions about your discharge instructions: Diet   No. Medications  No. Follow up visit  No.  Do you have questions or concerns about your Care? Yes.    Actions: * If pain score is 4 or above: No action needed, pain <4.

## 2022-08-17 ENCOUNTER — Telehealth: Payer: Self-pay | Admitting: Internal Medicine

## 2022-08-17 NOTE — Telephone Encounter (Signed)
Patient calling to follow up on results Please advise

## 2022-08-20 ENCOUNTER — Encounter: Payer: Self-pay | Admitting: Internal Medicine

## 2022-08-20 NOTE — Telephone Encounter (Signed)
Pt aware of results and recommendations per Dr. Rhea Belton. Recall entered in epic for 3 years.

## 2022-08-20 NOTE — Telephone Encounter (Signed)
Pt calling for path results. Please advise. 

## 2022-08-20 NOTE — Telephone Encounter (Signed)
4 out of 5 of the polyps were adenomatous 1 was hyperplastic No cancer  Repeat colonoscopy in 3 years Pathology letter sent

## 2023-06-13 ENCOUNTER — Ambulatory Visit: Payer: 59 | Admitting: Adult Health

## 2023-06-13 ENCOUNTER — Telehealth: Payer: Self-pay

## 2023-06-13 ENCOUNTER — Encounter: Payer: Self-pay | Admitting: Adult Health

## 2023-06-13 VITALS — BP 143/89 | HR 78 | Ht 73.0 in | Wt 219.0 lb

## 2023-06-13 DIAGNOSIS — G4730 Sleep apnea, unspecified: Secondary | ICD-10-CM

## 2023-06-13 DIAGNOSIS — G4733 Obstructive sleep apnea (adult) (pediatric): Secondary | ICD-10-CM

## 2023-06-13 NOTE — Telephone Encounter (Signed)
 We have confirmation that it was received because it was attached to my last note. Can we make sure they process and get this done

## 2023-06-13 NOTE — Telephone Encounter (Signed)
 Did we find out from them if he had an overnight pulse oximetry?  This was ordered at the last visit

## 2023-06-13 NOTE — Patient Instructions (Addendum)
 Continue using CPAP nightly and greater than 4 hours each night We will have nurse reach out about oxygen test in your data If your symptoms worsen or you develop new symptoms please let us know.

## 2023-06-13 NOTE — Progress Notes (Signed)
 PATIENT: Jason Chavez DOB: 19-Feb-1961  REASON FOR VISIT: follow up HISTORY FROM: patient PRIMARY NEUROLOGIST: Dr.Dohmeier  Chief Complaint  Patient presents with   Obstructive Sleep Apnea    Rm 1 alone Pt is well and stable. Reports no new OSA/CPAP concerns     HISTORY OF PRESENT ILLNESS: Today 06/13/23:  Jason Chavez is a 63 y.o. male with a history of OSA on CPAP. Returns today for follow-up.  At the last visit I did order an [facility] and it looks like his DME company received the order.  However he states he was never called about this.  He states that he does use his machine nightly.  He definitely can tell the benefit.  He was able to come off all his BP medication. DL is below      [id number] Chavez is a 63 y.o. male with a history of OSA on CPAP. Returns today for follow-up.  He reports that the CPAP is working well for him.  He states that he does not like to sleep without it.  He has noticed that on his phone app his oxygen level drops down to 88%.  He denies any new symptoms.  Returns today for evaluation.       REVIEW OF SYSTEMS: Out of a complete 14 system review of symptoms, the patient complains only of the following symptoms, and all other reviewed systems are negative.  FSS 21 ESS 5  ALLERGIES: Allergies  Allergen Reactions   Other     Adhesive,itchig,rednees    HOME MEDICATIONS: Outpatient Medications Prior to Visit  Medication Sig Dispense Refill   fluocinonide ointment (LIDEX) 0.05 % Apply topically 2 (two) times daily as needed.     fluticasone (CUTIVATE) 0.05 % cream Apply topically 2 (two) times daily.     ketoconazole (NIZORAL) 2 % cream Apply topically 2 (two) times daily.     loratadine (CLARITIN) 10 MG tablet Take 10 mg by mouth daily. TAKE ONE DAILY     OVER THE COUNTER MEDICATION      pantoprazole (PROTONIX) 40 MG tablet Take 40 mg by mouth daily as needed.     rosuvastatin (CRESTOR) 10 MG tablet Take 10 mg by  mouth at bedtime.     No facility-administered medications prior to visit.    PAST MEDICAL HISTORY: Past Medical History:  Diagnosis Date   Allergy    seasonal   Cataract    removed,left eye   GERD (gastroesophageal reflux disease)    Hyperlipemia    Hypertension    normal now after c pap updated 07/18/22   Retinal tear of left eye    Sleep apnea    wear c pap    PAST SURGICAL HISTORY: Past Surgical History:  Procedure Laterality Date   ANAL FISSURE REPAIR     EYE SURGERY Left     FAMILY HISTORY: Family History  Problem Relation Age of Onset   Heart disease Father    Sleep apnea Neg Hx    Colon cancer Neg Hx    Colon polyps Neg Hx    Crohn's disease Neg Hx    Esophageal cancer Neg Hx    Rectal cancer Neg Hx    Stomach cancer Neg Hx    Ulcerative colitis Neg Hx     SOCIAL HISTORY: Social History   Socioeconomic History   Marital status: Married    Spouse name: Not on file   Number of children: 1   Years of education: Not on  file   Highest education level: Not on file  Occupational History   Occupation: System analyst  Tobacco Use   Smoking status: Never   Smokeless tobacco: Never  Vaping Use   Vaping status: Never Used  Substance and Sexual Activity   Alcohol use: Yes    Comment: Wine and beer-socially   Drug use: No   Sexual activity: Not on file  Other Topics Concern   Not on file  Social History Narrative   Not on file   Social Drivers of Health   Financial Resource Strain: Not on file  Food Insecurity: Not on file  Transportation Needs: Not on file  Physical Activity: Not on file  Stress: Not on file  Social Connections: Not on file  Intimate Partner Violence: Not on file      PHYSICAL EXAM  Vitals:   06/13/23 0935  BP: (!) 143/89  Pulse: 78  Weight: 219 lb (99.3 kg)  Height: 6\' 1"  (1.854 m)   Body mass index is 28.89 kg/m.  Generalized: Well developed, in no acute distress  Chest: Lungs clear to auscultation  bilaterally  Neurological examination  Mentation: Alert oriented to time, place, history taking. Follows all commands speech and language fluent Cranial nerve II-XII: Facial symmetry noted   DIAGNOSTIC DATA (LABS, IMAGING, TESTING) - I reviewed patient records, labs, notes, testing and imaging myself where available.  No results found for: "WBC", "HGB", "HCT", "MCV", "PLT"    Component Value Date/Time   NA 138 02/28/2006 0906   K 4.6 02/28/2006 0906   CL 103 02/28/2006 0906   CO2 27 02/28/2006 0906   GLUCOSE 88 02/28/2006 0906   BUN 10 02/28/2006 0906   CREATININE 1.0 02/28/2006 0906   CALCIUM 9.7 02/28/2006 0906   PROT 7.8 06/03/2007 0813   ALBUMIN 4.1 06/03/2007 0813   AST 28 06/03/2007 0813   ALT 27 06/03/2007 0813   ALKPHOS 61 06/03/2007 0813   BILITOT 1.4 (H) 06/03/2007 0813   GFRNONAA 85 02/28/2006 0906   Lab Results  Component Value Date   CHOL 125 06/03/2007   HDL 28.9 (L) 06/03/2007   LDLCALC 66 06/03/2007   LDLDIRECT 175.1 02/28/2006   TRIG 152 (H) 06/03/2007   CHOLHDL 4.3 CALC 06/03/2007      ASSESSMENT AND PLAN 63 y.o. year old male  has a past medical history of Allergy, Cataract, GERD (gastroesophageal reflux disease), Hyperlipemia, Hypertension, Retinal tear of left eye, and Sleep apnea. here with:  OSA on CPAP  - CPAP compliance excellent - Good treatment of AHI  - Encourage patient to use CPAP nightly and > 4 hours each night - Will have nurse reach out to see why when it was not completed.  Will also asked him about his data not transferring for the last 2 weeks - F/U in 1 year or sooner if needed   Butch Penny, MSN, NP-C 06/13/2023, 10:11 AM [facility] [address] [place], [place] [address] [phone]

## 2023-06-13 NOTE — Telephone Encounter (Signed)
 Called [employer] and spoke to the compliance team and they stated that they see that the report stopped 05/26/23, and we are connected to pt's account, they are unsure if pt maybe not scanning his QR Code or, Pt has lost signal sometimes while trying to scan his code.  Will reach out to the pt.

## 2023-06-13 NOTE — Telephone Encounter (Signed)
 Called [employer] back and had them look up pt's ored for the overnight pulse Oximetry and was told that they didn't see it in the system. Rep stated that either they didn't receive the order or the order was never processed.

## 2023-06-19 NOTE — Telephone Encounter (Signed)
 I sent a community message high priority to [employer].

## 2023-06-19 NOTE — Addendum Note (Signed)
 Addended by: Burns Carwin on: 06/19/2023 10:17 AM   Modules accepted: Orders

## 2023-06-19 NOTE — Telephone Encounter (Signed)
 I called [employer] again. At this point the order from April 2024 has expired so we have to write a new one. The receptionist reviewed the past notes and didn't see anything about the ONO order.

## 2023-06-19 NOTE — Telephone Encounter (Signed)
 Jackline Maser, RN; Shirline Dover; Tucker, Dolanda; Ziegler, Melissa; [place], Gordon; 1 other Got it....thanks

## 2023-06-20 NOTE — Addendum Note (Signed)
 Addended by: Neomia Banner on: 06/20/2023 01:01 PM   Modules accepted: Orders

## 2023-06-20 NOTE — Telephone Encounter (Signed)
 Where is the ONO report ?

## 2023-07-22 ENCOUNTER — Telehealth: Payer: Self-pay | Admitting: Neurology

## 2023-07-22 NOTE — Telephone Encounter (Signed)
 Received a ONO on report that was completed while using CPAP. It was a total recording 8 hr 25 min and 32 sec. There was a total of 9 min 24 sec where the oxygen was spent below 89%. Will have Dr Dohmeier review the report and advise of results and next steps.

## 2023-07-24 NOTE — Telephone Encounter (Signed)
 Called the patient and reviewed the ONO on CPAP results with the patient. Pt verbalized understanding. Pt had no questions at this time but was encouraged to call back if questions arise.

## 2023-08-15 ENCOUNTER — Encounter: Payer: Self-pay | Admitting: Neurology

## 2023-10-08 DIAGNOSIS — L304 Erythema intertrigo: Secondary | ICD-10-CM | POA: Diagnosis not present

## 2023-10-08 DIAGNOSIS — L280 Lichen simplex chronicus: Secondary | ICD-10-CM | POA: Diagnosis not present

## 2023-10-08 DIAGNOSIS — L258 Unspecified contact dermatitis due to other agents: Secondary | ICD-10-CM | POA: Diagnosis not present

## 2023-10-21 DIAGNOSIS — R350 Frequency of micturition: Secondary | ICD-10-CM | POA: Diagnosis not present

## 2023-10-21 DIAGNOSIS — N401 Enlarged prostate with lower urinary tract symptoms: Secondary | ICD-10-CM | POA: Diagnosis not present

## 2023-10-21 DIAGNOSIS — R808 Other proteinuria: Secondary | ICD-10-CM | POA: Diagnosis not present

## 2023-10-21 DIAGNOSIS — R3915 Urgency of urination: Secondary | ICD-10-CM | POA: Diagnosis not present

## 2023-10-25 DIAGNOSIS — R809 Proteinuria, unspecified: Secondary | ICD-10-CM | POA: Diagnosis not present

## 2023-10-25 DIAGNOSIS — I129 Hypertensive chronic kidney disease with stage 1 through stage 4 chronic kidney disease, or unspecified chronic kidney disease: Secondary | ICD-10-CM | POA: Diagnosis not present

## 2023-10-26 IMAGING — CT CT CARDIAC CORONARY ARTERY CALCIUM SCORE
3 series · 14 of 20 positions shown, 16 images · non-contrast
Comparison: None.

CLINICAL DATA: 60-year-old Asian male with history of
hyperlipidemia, hypertension and family history of heart disease.

EXAM:
CT CARDIAC CORONARY ARTERY CALCIUM SCORE
TECHNIQUE: Non-contrast imaging through the heart was performed using
prospective ECG gating. Image post processing was performed on an
independent workstation, allowing for quantitative analysis of the
heart and coronary arteries. Note that this exam targets the heart
and the chest was not imaged in its entirety.

[Series 2: calcium scoring 2.00 qr36 bestdiast 71% hrt calciu · axial · 0.39mm/px · z∈[+1715,+1807]mm · 4 of 78 slices shown]
[im 16/78  vessel]
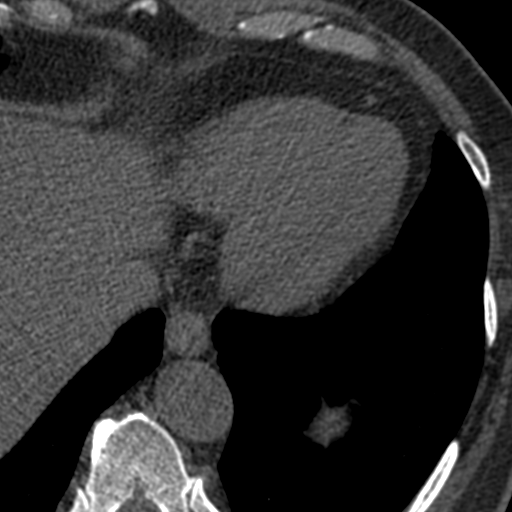
[im 31/78  vessel]
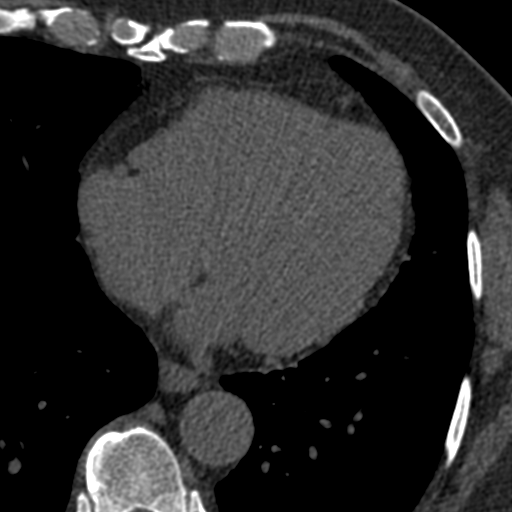
[im 47/78  vessel]
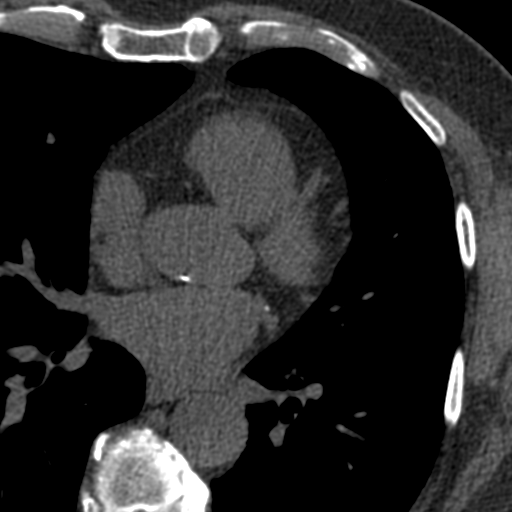
[im 62/78  vessel]
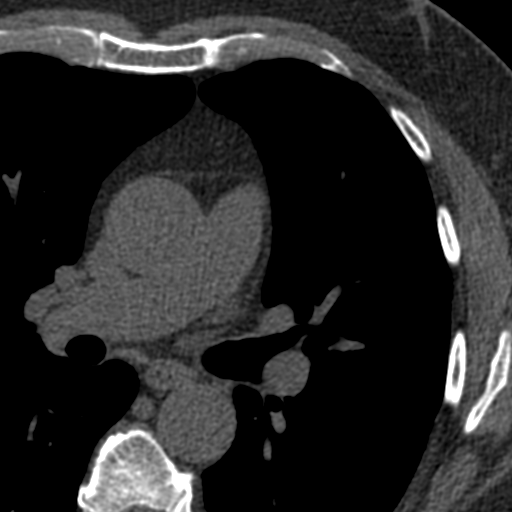

[Series 3: calcium scoring 2.00 br40 bestdiast 71% axial · axial · 0.64mm/px · z∈[+1707,+1813]mm · 5 of 79 slices shown, 7 images]
[im 14/79  vessel]
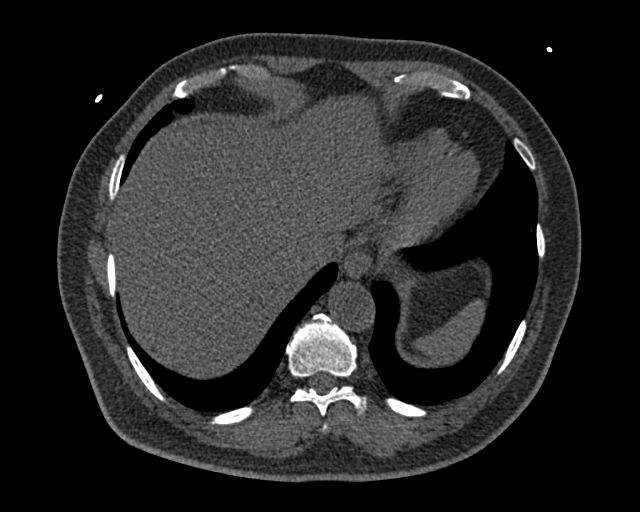
[im 14/79  lung]
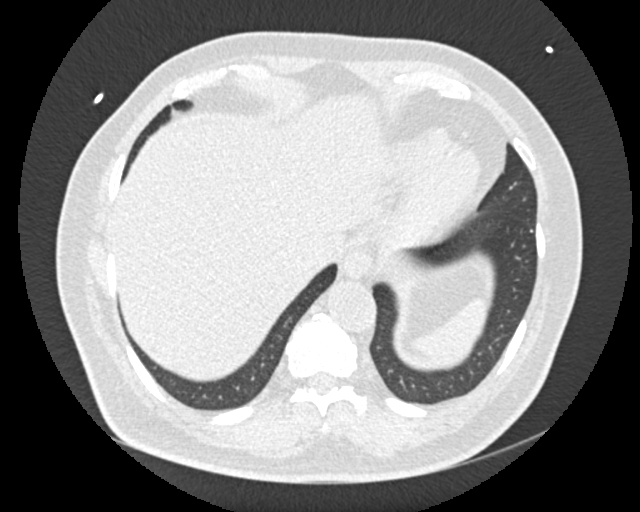
[im 27/79  vessel]
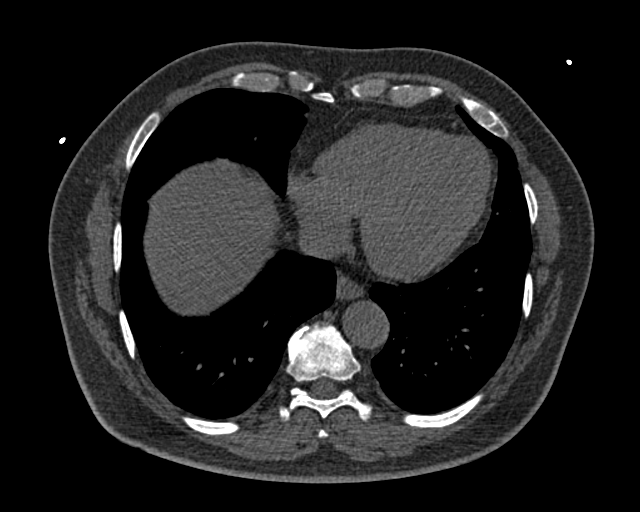
[im 40/79  vessel]
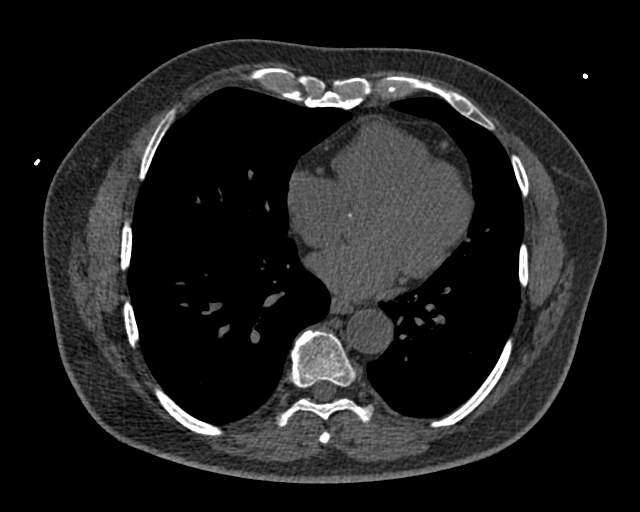
[im 53/79  vessel]
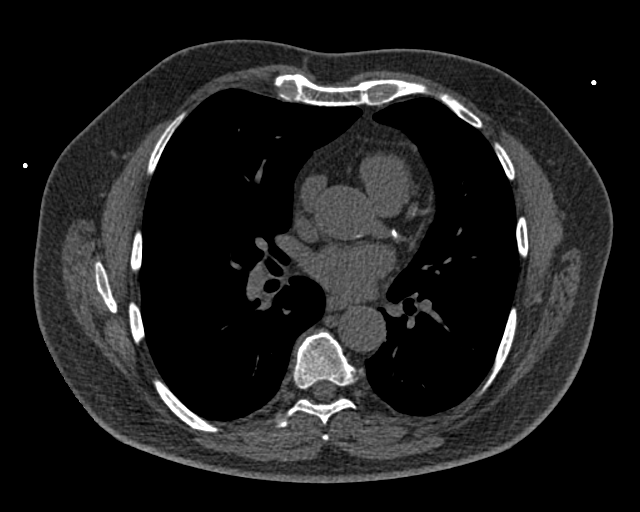
[im 66/79  vessel]
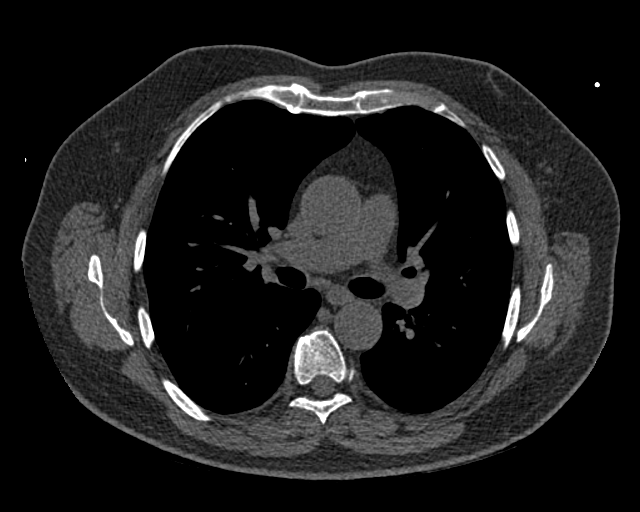
[im 66/79  lung]
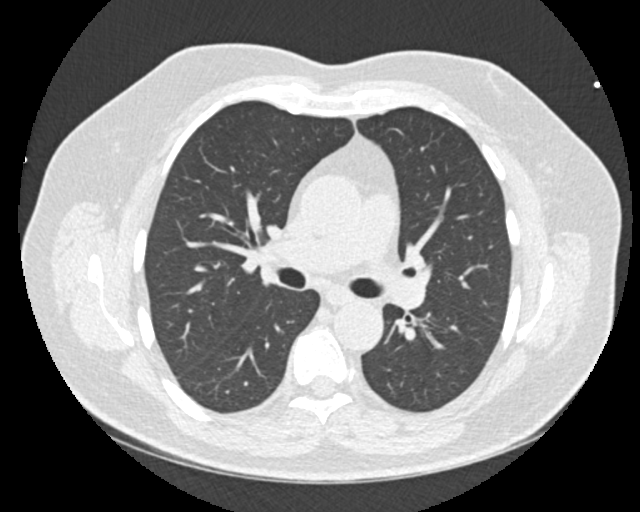

[Series 9: calcium scoring 2.00 br60 bestdiast 71% lungs · axial · 0.64mm/px · z∈[+1707,+1811]mm · 5 of 80 slices shown]
[im 14/80  vessel]
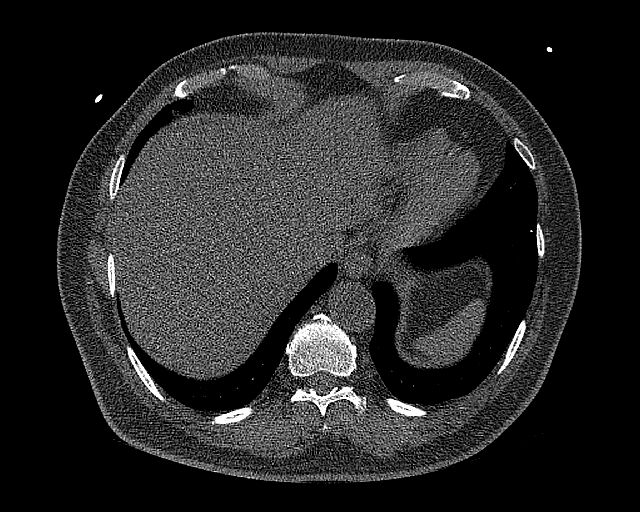
[im 27/80  vessel]
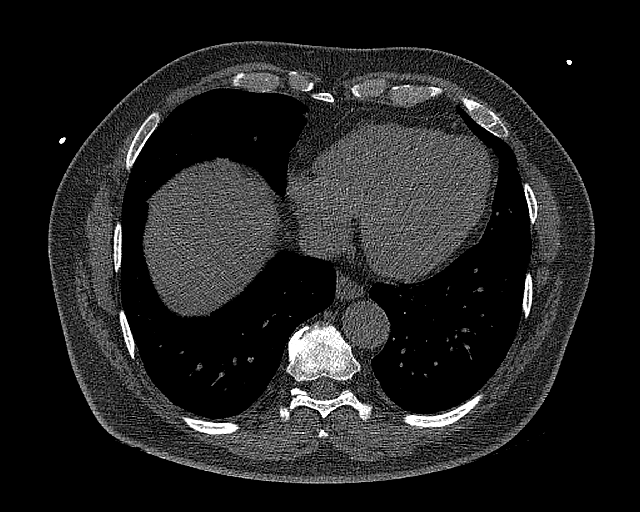
[im 40/80  vessel]
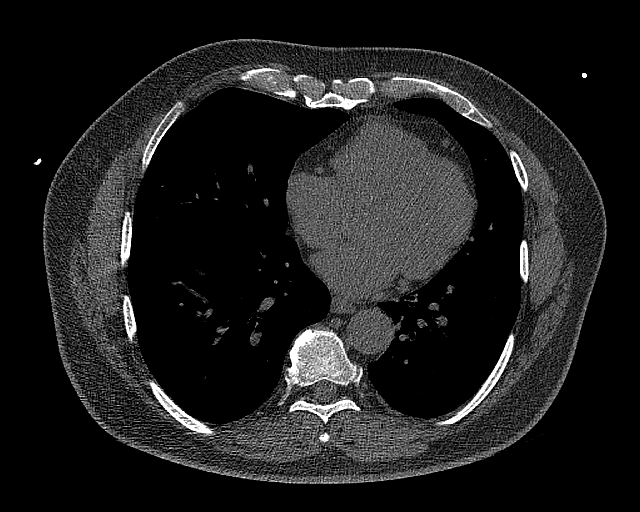
[im 53/80  vessel]
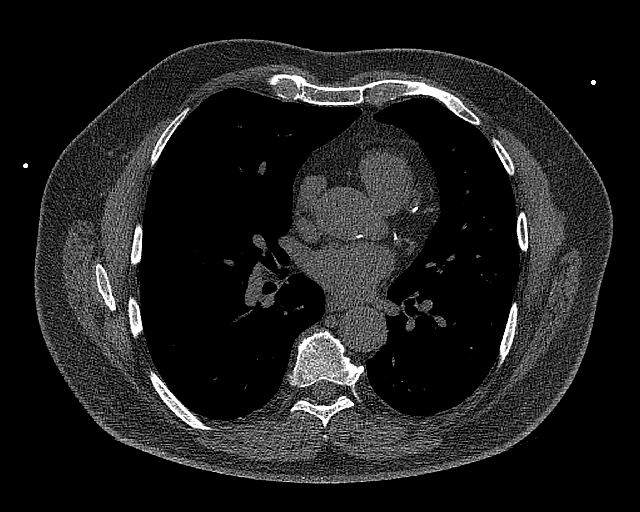
[im 66/80  vessel]
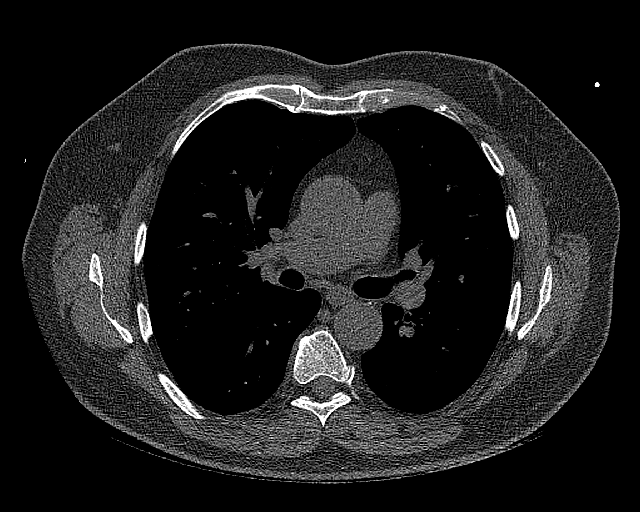

[14 of 20 positions shown; findings below may reference images not displayed]

FINDINGS: CORONARY CALCIUM SCORES:

Left Main: 0

LAD:

LCx:

RCA:

Total Agatston Score: 187

[HOSPITAL] percentile: 84

AORTA MEASUREMENTS:

Ascending Aorta: 37 mm

Descending Aorta: 29 mm

OTHER FINDINGS:

The heart size is within normal limits. No pericardial fluid is
identified. Visualized segments of the thoracic aorta and central
pulmonary arteries are normal in caliber. Visualized mediastinum and
hilar regions demonstrate no lymphadenopathy or masses. Visualized
lungs show no evidence of pulmonary edema, consolidation,
pneumothorax, nodule or pleural fluid. Visualized upper abdomen and
bony structures are unremarkable.
IMPRESSION: Coronary calcium score 187 is at the 84th percentile for the
patient's age, sex and race.

## 2023-11-26 DIAGNOSIS — G4733 Obstructive sleep apnea (adult) (pediatric): Secondary | ICD-10-CM | POA: Diagnosis not present

## 2023-12-05 DIAGNOSIS — I10 Essential (primary) hypertension: Secondary | ICD-10-CM | POA: Diagnosis not present

## 2023-12-05 DIAGNOSIS — K219 Gastro-esophageal reflux disease without esophagitis: Secondary | ICD-10-CM | POA: Diagnosis not present

## 2023-12-05 DIAGNOSIS — Z23 Encounter for immunization: Secondary | ICD-10-CM | POA: Diagnosis not present

## 2023-12-05 DIAGNOSIS — K5900 Constipation, unspecified: Secondary | ICD-10-CM | POA: Diagnosis not present

## 2023-12-05 DIAGNOSIS — T148XXA Other injury of unspecified body region, initial encounter: Secondary | ICD-10-CM | POA: Diagnosis not present

## 2023-12-05 DIAGNOSIS — R1032 Left lower quadrant pain: Secondary | ICD-10-CM | POA: Diagnosis not present

## 2023-12-25 DIAGNOSIS — I129 Hypertensive chronic kidney disease with stage 1 through stage 4 chronic kidney disease, or unspecified chronic kidney disease: Secondary | ICD-10-CM | POA: Diagnosis not present

## 2023-12-27 DIAGNOSIS — E785 Hyperlipidemia, unspecified: Secondary | ICD-10-CM | POA: Diagnosis not present

## 2023-12-27 DIAGNOSIS — R809 Proteinuria, unspecified: Secondary | ICD-10-CM | POA: Diagnosis not present

## 2023-12-27 DIAGNOSIS — I10 Essential (primary) hypertension: Secondary | ICD-10-CM | POA: Diagnosis not present

## 2023-12-27 DIAGNOSIS — R1032 Left lower quadrant pain: Secondary | ICD-10-CM | POA: Diagnosis not present

## 2023-12-27 DIAGNOSIS — N401 Enlarged prostate with lower urinary tract symptoms: Secondary | ICD-10-CM | POA: Diagnosis not present

## 2023-12-27 DIAGNOSIS — I2510 Atherosclerotic heart disease of native coronary artery without angina pectoris: Secondary | ICD-10-CM | POA: Diagnosis not present

## 2023-12-27 DIAGNOSIS — G4733 Obstructive sleep apnea (adult) (pediatric): Secondary | ICD-10-CM | POA: Diagnosis not present

## 2023-12-27 DIAGNOSIS — T148XXA Other injury of unspecified body region, initial encounter: Secondary | ICD-10-CM | POA: Diagnosis not present

## 2023-12-27 DIAGNOSIS — N138 Other obstructive and reflux uropathy: Secondary | ICD-10-CM | POA: Diagnosis not present

## 2023-12-27 DIAGNOSIS — L409 Psoriasis, unspecified: Secondary | ICD-10-CM | POA: Diagnosis not present

## 2023-12-31 DIAGNOSIS — E559 Vitamin D deficiency, unspecified: Secondary | ICD-10-CM | POA: Diagnosis not present

## 2023-12-31 DIAGNOSIS — I129 Hypertensive chronic kidney disease with stage 1 through stage 4 chronic kidney disease, or unspecified chronic kidney disease: Secondary | ICD-10-CM | POA: Diagnosis not present

## 2023-12-31 DIAGNOSIS — R809 Proteinuria, unspecified: Secondary | ICD-10-CM | POA: Diagnosis not present

## 2024-01-02 ENCOUNTER — Other Ambulatory Visit (HOSPITAL_COMMUNITY): Payer: Self-pay | Admitting: Nephrology

## 2024-01-02 DIAGNOSIS — R809 Proteinuria, unspecified: Secondary | ICD-10-CM

## 2024-01-09 DIAGNOSIS — H6123 Impacted cerumen, bilateral: Secondary | ICD-10-CM | POA: Diagnosis not present

## 2024-01-09 DIAGNOSIS — H900 Conductive hearing loss, bilateral: Secondary | ICD-10-CM | POA: Diagnosis not present

## 2024-01-17 DIAGNOSIS — I10 Essential (primary) hypertension: Secondary | ICD-10-CM | POA: Diagnosis not present

## 2024-01-17 DIAGNOSIS — K5900 Constipation, unspecified: Secondary | ICD-10-CM | POA: Diagnosis not present

## 2024-01-17 DIAGNOSIS — T148XXA Other injury of unspecified body region, initial encounter: Secondary | ICD-10-CM | POA: Diagnosis not present

## 2024-01-17 DIAGNOSIS — Z029 Encounter for administrative examinations, unspecified: Secondary | ICD-10-CM | POA: Diagnosis not present

## 2024-01-17 DIAGNOSIS — K219 Gastro-esophageal reflux disease without esophagitis: Secondary | ICD-10-CM | POA: Diagnosis not present

## 2024-01-20 DIAGNOSIS — E785 Hyperlipidemia, unspecified: Secondary | ICD-10-CM | POA: Diagnosis not present

## 2024-01-27 ENCOUNTER — Other Ambulatory Visit (HOSPITAL_COMMUNITY): Payer: Self-pay | Admitting: Radiology

## 2024-01-27 DIAGNOSIS — R809 Proteinuria, unspecified: Secondary | ICD-10-CM | POA: Diagnosis not present

## 2024-01-27 DIAGNOSIS — I10 Essential (primary) hypertension: Secondary | ICD-10-CM | POA: Diagnosis not present

## 2024-01-27 DIAGNOSIS — L409 Psoriasis, unspecified: Secondary | ICD-10-CM | POA: Diagnosis not present

## 2024-01-27 DIAGNOSIS — K219 Gastro-esophageal reflux disease without esophagitis: Secondary | ICD-10-CM | POA: Diagnosis not present

## 2024-01-27 DIAGNOSIS — R748 Abnormal levels of other serum enzymes: Secondary | ICD-10-CM | POA: Diagnosis not present

## 2024-01-27 DIAGNOSIS — B356 Tinea cruris: Secondary | ICD-10-CM | POA: Diagnosis not present

## 2024-01-27 DIAGNOSIS — E785 Hyperlipidemia, unspecified: Secondary | ICD-10-CM | POA: Diagnosis not present

## 2024-01-27 DIAGNOSIS — Z0000 Encounter for general adult medical examination without abnormal findings: Secondary | ICD-10-CM | POA: Diagnosis not present

## 2024-01-27 DIAGNOSIS — N401 Enlarged prostate with lower urinary tract symptoms: Secondary | ICD-10-CM | POA: Diagnosis not present

## 2024-01-27 DIAGNOSIS — I2510 Atherosclerotic heart disease of native coronary artery without angina pectoris: Secondary | ICD-10-CM | POA: Diagnosis not present

## 2024-01-27 DIAGNOSIS — G4733 Obstructive sleep apnea (adult) (pediatric): Secondary | ICD-10-CM | POA: Diagnosis not present

## 2024-01-28 ENCOUNTER — Other Ambulatory Visit: Payer: Self-pay | Admitting: Interventional Radiology

## 2024-01-29 ENCOUNTER — Ambulatory Visit (HOSPITAL_COMMUNITY)
Admission: RE | Admit: 2024-01-29 | Discharge: 2024-01-29 | Disposition: A | Discharge status: Home / Self Care | Payer: Self-pay | Source: Ambulatory Visit | Attending: Nephrology | Admitting: Nephrology

## 2024-01-29 ENCOUNTER — Other Ambulatory Visit: Payer: Self-pay

## 2024-01-29 DIAGNOSIS — R809 Proteinuria, unspecified: Secondary | ICD-10-CM

## 2024-01-29 LAB — CBC
HCT: 37.2 % — ABNORMAL LOW (ref 39.0–52.0)
Hemoglobin: 12.6 g/dL — ABNORMAL LOW (ref 13.0–17.0)
MCH: 30.8 pg (ref 26.0–34.0)
MCHC: 33.9 g/dL (ref 30.0–36.0)
MCV: 91 fL (ref 80.0–100.0)
Platelets: 301 K/uL (ref 150–400)
RBC: 4.09 MIL/uL — ABNORMAL LOW (ref 4.22–5.81)
RDW: 13.6 % (ref 11.5–15.5)
WBC: 9.8 K/uL (ref 4.0–10.5)
nRBC: 0 % (ref 0.0–0.2)

## 2024-01-29 LAB — PROTIME-INR
INR: 1.1 (ref 0.8–1.2)
Prothrombin Time: 15 s (ref 11.4–15.2)

## 2024-01-29 MED ORDER — MIDAZOLAM HCL (PF) 2 MG/2ML IJ SOLN
INTRAMUSCULAR | Status: AC | PRN
Start: 2024-01-29 — End: 2024-01-29
  Administered 2024-01-29: .5 mg via INTRAVENOUS
  Administered 2024-01-29 (×2): 1 mg via INTRAVENOUS

## 2024-01-29 MED ORDER — LIDOCAINE HCL (PF) 1 % IJ SOLN
10.0000 mL | Freq: Once | INTRAMUSCULAR | Status: AC
  Administered 2024-01-29: 10 mL

## 2024-01-29 MED ORDER — FENTANYL CITRATE (PF) 100 MCG/2ML IJ SOLN
INTRAMUSCULAR | Status: AC
  Filled 2024-01-29: qty 4

## 2024-01-29 MED ORDER — MIDAZOLAM HCL 2 MG/2ML IJ SOLN
INTRAMUSCULAR | Status: AC
  Filled 2024-01-29: qty 4

## 2024-01-29 MED ORDER — FENTANYL CITRATE (PF) 100 MCG/2ML IJ SOLN
INTRAMUSCULAR | Status: AC | PRN
  Administered 2024-01-29 (×2): 50 ug via INTRAVENOUS

## 2024-01-29 MED ORDER — SODIUM CHLORIDE 0.9 % IV SOLN: INTRAVENOUS | Status: DC

## 2024-01-29 MED ORDER — GELATIN ABSORBABLE 12-7 MM EX MISC
1.0000 | Freq: Once | CUTANEOUS | Status: AC
  Administered 2024-01-29: 1 via TOPICAL

## 2024-01-29 NOTE — Progress Notes (Signed)
 Discharge instructions reviewed with patient and spouse at bedside. Denies questions concerns. PT tolerated PO intake. Ambulated in the hallway, was able to void without difficulty. Incision site remains clean dry and intact. No s/s of complications. PT escorted from the unit via wheel chair to personal vehicle.

## 2024-01-29 NOTE — Procedures (Signed)
 Interventional Radiology Procedure:   Indications: Proteinuria  Procedure: US  guided left renal biopsy  Findings: 2 core biopsies from left kidney lower pole, gelfoam slurry injected along biopsy tract.    Complications: None     EBL: Minimal   Plan:  Strict bedrest 3 hours   Anastyn Ayars R. Philip, MD  Pager: [phone]

## 2024-01-29 NOTE — H&P (Signed)
 Chief Complaint: Nephrotic range proteinuria - IR consulted for image guided random renal biopsy  Referring Provider(s): Tobie Gordy POUR, MD  Supervising Physician: Philip Cornet  Patient Status: [facility] - Out-pt  History of Present Illness: Jason Chavez is a 63 y.o. male with pmhx of GERD, HLD, HTN. He has been following with [facility] for proteinuria. He has had negative lab testing for membranous nephropathy. Albumin and GFR have remained normal. He was started on an ARB to try and control proteinuria, but has persisted. IR now consulted for image guided random renal biopsy for further diagnostic evaluation.  Today patient with no complaint. Has bene NPO since midnight. Denies any anticoagulation.   Patient is Full Code  Past Medical History:  Diagnosis Date   Allergy    seasonal   Cataract    removed,left eye   GERD (gastroesophageal reflux disease)    Hyperlipemia    Hypertension    normal now after c pap updated 07/18/22   Retinal tear of left eye    Sleep apnea    wear c pap    Past Surgical History:  Procedure Laterality Date   ANAL FISSURE REPAIR     EYE SURGERY Left     Allergies: Other  Medications: Prior to Admission medications   Medication Sig Start Date End Date Taking? Authorizing Provider  fluocinonide ointment (LIDEX) 0.05 % Apply topically 2 (two) times daily as needed. 10/06/19  Yes [provider]  fluticasone (CUTIVATE) 0.05 % cream Apply topically 2 (two) times daily. 07/11/22  Yes [provider]  ketoconazole (NIZORAL) 2 % cream Apply topically 2 (two) times daily. 07/11/22  Yes [provider]  loratadine (CLARITIN) 10 MG tablet Take 10 mg by mouth daily. TAKE ONE DAILY   Yes [provider]  olmesartan (BENICAR) 20 MG tablet Take 20 mg by mouth daily.   Yes [provider]  pantoprazole (PROTONIX) 40 MG tablet Take 40 mg by mouth daily as needed. 10/06/19  Yes [provider]   rosuvastatin (CRESTOR) 10 MG tablet Take 10 mg by mouth at bedtime. 06/10/21  Yes [provider]  OVER THE COUNTER MEDICATION     [provider]     Family History  Problem Relation Age of Onset   Heart disease Father    Sleep apnea Neg Hx    Colon cancer Neg Hx    Colon polyps Neg Hx    Crohn's disease Neg Hx    Esophageal cancer Neg Hx    Rectal cancer Neg Hx    Stomach cancer Neg Hx    Ulcerative colitis Neg Hx     Social History   Socioeconomic History   Marital status: Married    Spouse name: Not on file   Number of children: 1   Years of education: Not on file   Highest education level: Not on file  Occupational History   Occupation: System analyst  Tobacco Use   Smoking status: Never   Smokeless tobacco: Never  Vaping Use   Vaping status: Never Used  Substance and Sexual Activity   Alcohol  use: Yes    Comment: Wine and beer-socially   Drug use: No   Sexual activity: Not on file  Other Topics Concern   Not on file  Social History Narrative   Not on file   Social Drivers of Health   Financial Resource Strain: Not on file  Food Insecurity: Not on file  Transportation Needs: Not on  file  Physical Activity: Not on file  Stress: Not on file  Social Connections: Not on file     Review of Systems: A 12 point ROS discussed and pertinent positives are indicated in the HPI above.  All other systems are negative.  Vital Signs: BP (!) 152/89 (BP Location: Right Arm)   Pulse (!) 51   Temp 98.7 F (37.1 C) (Oral)   Resp 16   Ht 6' 1 (1.854 m)   Wt 217 lb (98.4 kg)   SpO2 100%   BMI 28.63 kg/m   Advance Care Plan: No documents on file  Physical Exam Vitals and nursing note reviewed.  Constitutional:      General: He is not in acute distress. HENT:     Mouth/Throat:     Mouth: Mucous membranes are moist.     Pharynx: Oropharynx is clear.  Cardiovascular:     Rate and Rhythm: Regular rhythm. Bradycardia present.  Pulmonary:      Effort: Pulmonary effort is normal.     Breath sounds: Normal breath sounds.  Abdominal:     Palpations: Abdomen is soft.     Tenderness: There is no abdominal tenderness.  Musculoskeletal:     Right lower leg: No edema.     Left lower leg: No edema.  Skin:    General: Skin is warm and dry.  Neurological:     Mental Status: He is alert and oriented to person, place, and time. Mental status is at baseline.     Imaging: No results found.  Labs:  CBC: Recent Labs    01/29/24 0646  WBC 9.8  HGB 12.6*  HCT 37.2*  PLT 301    COAGS: Recent Labs    01/29/24 0646  INR 1.1    BMP: No results for input(s): NA, K, CL, CO2, GLUCOSE, BUN, CALCIUM, CREATININE, GFRNONAA, GFRAA in the last 8760 hours.  Invalid input(s): CMP  LIVER FUNCTION TESTS: No results for input(s): BILITOT, AST, ALT, ALKPHOS, PROT, ALBUMIN in the last 8760 hours.  TUMOR MARKERS: No results for input(s): AFPTM, CEA, CA199, CHROMGRNA in the last 8760 hours.  Assessment and Plan:  Jason Chavez is a 63 y.o. male with pmhx of GERD, HLD, HTN. He has been following with [facility] for proteinuria. He has had negative lab testing for membranous nephropathy. Albumin and GFR have remained normal. He was started on an ARB to try and control proteinuria, but has persisted. IR now consulted for image guided random renal biopsy for further diagnostic evaluation.  Today patient with no complaint. Has bene NPO since midnight. Denies any anticoagulation.  Risks and benefits of random renal biopsy was discussed with the patient and/or patient's family including, but not limited to bleeding, infection, damage to adjacent structures or low yield requiring additional tests.  All of the questions were answered and there is agreement to proceed.  Consent signed and in chart.   Thank you for allowing our service to participate in Antuan Burleigh 's  care.  Electronically Signed: Kimble VEAR Clas, PA-C   01/29/2024, 7:43 AM      I spent a total of  15 Minutes   in face to face in clinical consultation, greater than 50% of which was counseling/coordinating care for random renal biopsy

## 2024-02-06 ENCOUNTER — Encounter (HOSPITAL_COMMUNITY): Payer: Self-pay

## 2024-02-17 LAB — SURGICAL PATHOLOGY

## 2024-06-16 ENCOUNTER — Telehealth: Admitting: Adult Health
